# Patient Record
Sex: Female | Born: 1975 | Race: White | Hispanic: No | Marital: Married | Smoking: Current every day smoker
Health system: Southern US, Community
[De-identification: ages and names within clinical notes are randomized; demographics above are authoritative.]

## PROBLEM LIST (undated history)

## (undated) DIAGNOSIS — F329 Major depressive disorder, single episode, unspecified: Secondary | ICD-10-CM

## (undated) DIAGNOSIS — J189 Pneumonia, unspecified organism: Secondary | ICD-10-CM

## (undated) DIAGNOSIS — F419 Anxiety disorder, unspecified: Secondary | ICD-10-CM

## (undated) DIAGNOSIS — A419 Sepsis, unspecified organism: Secondary | ICD-10-CM

## (undated) DIAGNOSIS — F32A Depression, unspecified: Secondary | ICD-10-CM

## (undated) DIAGNOSIS — D649 Anemia, unspecified: Secondary | ICD-10-CM

## (undated) HISTORY — PX: DILATION AND CURETTAGE OF UTERUS: SHX78

## (undated) HISTORY — PX: TONSILLECTOMY: SUR1361

---

## 2013-11-15 ENCOUNTER — Emergency Department (HOSPITAL_COMMUNITY): Payer: 59

## 2013-11-15 ENCOUNTER — Emergency Department (HOSPITAL_COMMUNITY)
Admission: EM | Admit: 2013-11-15 | Discharge: 2013-11-15 | Disposition: A | Discharge status: Home / Self Care | Payer: 59 | Attending: Emergency Medicine | Admitting: Emergency Medicine

## 2013-11-15 ENCOUNTER — Encounter (HOSPITAL_COMMUNITY): Payer: Self-pay | Admitting: Emergency Medicine

## 2013-11-15 DIAGNOSIS — Z720 Tobacco use: Secondary | ICD-10-CM | POA: Diagnosis not present

## 2013-11-15 DIAGNOSIS — N8320 Unspecified ovarian cysts: Secondary | ICD-10-CM | POA: Insufficient documentation

## 2013-11-15 DIAGNOSIS — F419 Anxiety disorder, unspecified: Secondary | ICD-10-CM | POA: Diagnosis not present

## 2013-11-15 DIAGNOSIS — F319 Bipolar disorder, unspecified: Secondary | ICD-10-CM | POA: Insufficient documentation

## 2013-11-15 DIAGNOSIS — Z8619 Personal history of other infectious and parasitic diseases: Secondary | ICD-10-CM | POA: Diagnosis not present

## 2013-11-15 DIAGNOSIS — Z8701 Personal history of pneumonia (recurrent): Secondary | ICD-10-CM | POA: Diagnosis not present

## 2013-11-15 DIAGNOSIS — N760 Acute vaginitis: Secondary | ICD-10-CM | POA: Insufficient documentation

## 2013-11-15 DIAGNOSIS — Z3202 Encounter for pregnancy test, result negative: Secondary | ICD-10-CM | POA: Insufficient documentation

## 2013-11-15 DIAGNOSIS — R109 Unspecified abdominal pain: Secondary | ICD-10-CM | POA: Diagnosis present

## 2013-11-15 DIAGNOSIS — Z79899 Other long term (current) drug therapy: Secondary | ICD-10-CM | POA: Insufficient documentation

## 2013-11-15 DIAGNOSIS — B9689 Other specified bacterial agents as the cause of diseases classified elsewhere: Secondary | ICD-10-CM

## 2013-11-15 DIAGNOSIS — N83201 Unspecified ovarian cyst, right side: Secondary | ICD-10-CM

## 2013-11-15 HISTORY — DX: Major depressive disorder, single episode, unspecified: F32.9

## 2013-11-15 HISTORY — DX: Depression, unspecified: F32.A

## 2013-11-15 HISTORY — DX: Sepsis, unspecified organism: A41.9

## 2013-11-15 HISTORY — DX: Anxiety disorder, unspecified: F41.9

## 2013-11-15 HISTORY — DX: Pneumonia, unspecified organism: J18.9

## 2013-11-15 LAB — COMPREHENSIVE METABOLIC PANEL WITH GFR
ALT: 7 U/L (ref 0–35)
AST: 10 U/L (ref 0–37)
Albumin: 3.6 g/dL (ref 3.5–5.2)
Alkaline Phosphatase: 40 U/L (ref 39–117)
Anion gap: 13 (ref 5–15)
BUN: 12 mg/dL (ref 6–23)
CO2: 25 meq/L (ref 19–32)
Calcium: 8.3 mg/dL — ABNORMAL LOW (ref 8.4–10.5)
Chloride: 101 meq/L (ref 96–112)
Creatinine, Ser: 0.72 mg/dL (ref 0.50–1.10)
GFR calc Af Amer: >90 mL/min
GFR calc non Af Amer: >90 mL/min
Glucose, Bld: 83 mg/dL (ref 70–99)
Potassium: 3.4 meq/L — ABNORMAL LOW (ref 3.7–5.3)
Sodium: 139 meq/L (ref 137–147)
Total Bilirubin: <0.2 mg/dL — ABNORMAL LOW (ref 0.3–1.2)
Total Protein: 6.4 g/dL (ref 6.0–8.3)

## 2013-11-15 LAB — URINALYSIS, ROUTINE W REFLEX MICROSCOPIC
Bilirubin Urine: NEGATIVE
GLUCOSE, UA: NEGATIVE mg/dL
HGB URINE DIPSTICK: NEGATIVE
Ketones, ur: NEGATIVE mg/dL
Leukocytes, UA: NEGATIVE
Nitrite: NEGATIVE
Protein, ur: NEGATIVE mg/dL
Specific Gravity, Urine: 1.015 (ref 1.005–1.030)
Urobilinogen, UA: 0.2 mg/dL (ref 0.0–1.0)
pH: 6 (ref 5.0–8.0)

## 2013-11-15 LAB — CBC WITH DIFFERENTIAL/PLATELET
Basophils Absolute: 0.1 K/uL (ref 0.0–0.1)
Basophils Relative: 1 % (ref 0–1)
Eosinophils Absolute: 0.2 K/uL (ref 0.0–0.7)
Eosinophils Relative: 2 % (ref 0–5)
HCT: 35.1 % — ABNORMAL LOW (ref 36.0–46.0)
Hemoglobin: 11.3 g/dL — ABNORMAL LOW (ref 12.0–15.0)
Lymphocytes Relative: 40 % (ref 12–46)
Lymphs Abs: 3.2 K/uL (ref 0.7–4.0)
MCH: 26.3 pg (ref 26.0–34.0)
MCHC: 32.2 g/dL (ref 30.0–36.0)
MCV: 81.8 fL (ref 78.0–100.0)
Monocytes Absolute: 0.7 K/uL (ref 0.1–1.0)
Monocytes Relative: 9 % (ref 3–12)
Neutro Abs: 3.8 K/uL (ref 1.7–7.7)
Neutrophils Relative %: 48 % (ref 43–77)
Platelets: 202 K/uL (ref 150–400)
RBC: 4.29 MIL/uL (ref 3.87–5.11)
RDW: 15 % (ref 11.5–15.5)
WBC: 8 K/uL (ref 4.0–10.5)

## 2013-11-15 LAB — WET PREP, GENITAL
TRICH WET PREP: NONE SEEN
YEAST WET PREP: NONE SEEN

## 2013-11-15 LAB — PREGNANCY, URINE: Preg Test, Ur: NEGATIVE

## 2013-11-15 MED ORDER — KETOROLAC TROMETHAMINE 30 MG/ML IJ SOLN
30.0000 mg | Freq: Once | INTRAMUSCULAR | Status: AC
  Administered 2013-11-15: 30 mg via INTRAVENOUS
  Filled 2013-11-15: qty 1

## 2013-11-15 MED ORDER — HYDROCODONE-ACETAMINOPHEN 5-325 MG PO TABS
1.0000 | ORAL_TABLET | Freq: Four times a day (QID) | ORAL | Status: AC | PRN
Start: 2013-11-15 — End: ?

## 2013-11-15 MED ORDER — METRONIDAZOLE 500 MG PO TABS: 500.0000 mg | ORAL_TABLET | Freq: Two times a day (BID) | ORAL | Status: AC

## 2013-11-15 MED ORDER — ONDANSETRON HCL 4 MG/2ML IJ SOLN
4.0000 mg | Freq: Once | INTRAMUSCULAR | Status: AC
  Administered 2013-11-15: 4 mg via INTRAVENOUS
  Filled 2013-11-15: qty 2

## 2013-11-15 MED ORDER — ONDANSETRON 4 MG PO TBDP: 4.0000 mg | ORAL_TABLET | Freq: Three times a day (TID) | ORAL | Status: AC | PRN

## 2013-11-15 MED ORDER — SODIUM CHLORIDE 0.9 % IV SOLN
INTRAVENOUS | Status: DC
Start: 2013-11-15 — End: 2013-11-15
  Administered 2013-11-15: 04:00:00 via INTRAVENOUS

## 2013-11-15 MED ORDER — HYDROCODONE-ACETAMINOPHEN 5-325 MG PO TABS
1.0000 | ORAL_TABLET | ORAL | Status: AC | PRN
Start: 2013-11-15 — End: ?

## 2013-11-15 MED ORDER — IBUPROFEN 800 MG PO TABS: 800.0000 mg | ORAL_TABLET | Freq: Three times a day (TID) | ORAL | Status: AC | PRN

## 2013-11-15 NOTE — Discharge Instructions (Signed)
Ovarian Cyst An ovarian cyst is a fluid-filled sac that forms on an ovary. The ovaries are small organs that produce eggs in women. Various types of cysts can form on the ovaries. Most are not cancerous. Many do not cause problems, and they often go away on their own. Some may cause symptoms and require treatment. Common types of ovarian cysts include:  Functional cysts--These cysts may occur every month during the menstrual cycle. This is normal. The cysts usually go away with the next menstrual cycle if the woman does not get pregnant. Usually, there are no symptoms with a functional cyst.  Endometrioma cysts--These cysts form from the tissue that lines the uterus. They are also called "chocolate cysts" because they become filled with blood that turns brown. This type of cyst can cause pain in the lower abdomen during intercourse and with your menstrual period.  Cystadenoma cysts--This type develops from the cells on the outside of the ovary. These cysts can get very big and cause lower abdomen pain and pain with intercourse. This type of cyst can twist on itself, cut off its blood supply, and cause severe pain. It can also easily rupture and cause a lot of pain.  Dermoid cysts--This type of cyst is sometimes found in both ovaries. These cysts may contain different kinds of body tissue, such as skin, teeth, hair, or cartilage. They usually do not cause symptoms unless they get very big.  Theca lutein cysts--These cysts occur when too much of a certain hormone (human chorionic gonadotropin) is produced and overstimulates the ovaries to produce an egg. This is most common after procedures used to assist with the conception of a baby (in vitro fertilization). CAUSES   Fertility drugs can cause a condition in which multiple large cysts are formed on the ovaries. This is called ovarian hyperstimulation syndrome.  A condition called polycystic ovary syndrome can cause hormonal imbalances that can lead to  nonfunctional ovarian cysts. SIGNS AND SYMPTOMS  Many ovarian cysts do not cause symptoms. If symptoms are present, they may include:  Pelvic pain or pressure.  Pain in the lower abdomen.  Pain during sexual intercourse.  Increasing girth (swelling) of the abdomen.  Abnormal menstrual periods.  Increasing pain with menstrual periods.  Stopping having menstrual periods without being pregnant. DIAGNOSIS  These cysts are commonly found during a routine or annual pelvic exam. Tests may be ordered to find out more about the cyst. These tests may include:  Ultrasound.  X-ray of the pelvis.  CT scan.  MRI.  Blood tests. TREATMENT  Many ovarian cysts go away on their own without treatment. Your health care provider may want to check your cyst regularly for 2-3 months to see if it changes. For women in menopause, it is particularly important to monitor a cyst closely because of the higher rate of ovarian cancer in menopausal women. When treatment is needed, it may include any of the following:  A procedure to drain the cyst (aspiration). This may be done using a long needle and ultrasound. It can also be done through a laparoscopic procedure. This involves using a thin, lighted tube with a tiny camera on the end (laparoscope) inserted through a small incision.  Surgery to remove the whole cyst. This may be done using laparoscopic surgery or an open surgery involving a larger incision in the lower abdomen.  Hormone treatment or birth control pills. These methods are sometimes used to help dissolve a cyst. HOME CARE INSTRUCTIONS   Only take over-the-counter  or prescription medicines as directed by your health care provider.  Follow up with your health care provider as directed.  Get regular pelvic exams and Pap tests. SEEK MEDICAL CARE IF:   Your periods are late, irregular, or painful, or they stop.  Your pelvic pain or abdominal pain does not go away.  Your abdomen becomes  larger or swollen.  You have pressure on your bladder or trouble emptying your bladder completely.  You have pain during sexual intercourse.  You have feelings of fullness, pressure, or discomfort in your stomach.  You lose weight for no apparent reason.  You feel generally ill.  You become constipated.  You lose your appetite.  You develop acne.  You have an increase in body and facial hair.  You are gaining weight, without changing your exercise and eating habits.  You think you are pregnant. SEEK IMMEDIATE MEDICAL CARE IF:   You have increasing abdominal pain.  You feel sick to your stomach (nauseous), and you throw up (vomit).  You develop a fever that comes on suddenly.  You have abdominal pain during a bowel movement.  Your menstrual periods become heavier than usual. MAKE SURE YOU:  Understand these instructions.  Will watch your condition.  Will get help right away if you are not doing well or get worse. Document Released: 01/25/2005 Document Revised: 01/30/2013 Document Reviewed: 10/02/2012 [facility] Patient Information 2015 [place], [place]. This information is not intended to replace advice given to you by your health care provider. Make sure you discuss any questions you have with your health care provider.    Bacterial Vaginosis Bacterial vaginosis is a vaginal infection that occurs when the normal balance of bacteria in the vagina is disrupted. It results from an overgrowth of certain bacteria. This is the most common vaginal infection in women of childbearing age. Treatment is important to prevent complications, especially in pregnant women, as it can cause a premature delivery. CAUSES  Bacterial vaginosis is caused by an increase in harmful bacteria that are normally present in smaller amounts in the vagina. Several different kinds of bacteria can cause bacterial vaginosis. However, the reason that the condition develops is not fully understood. RISK  FACTORS Certain activities or behaviors can put you at an increased risk of developing bacterial vaginosis, including:  Having a new sex partner or multiple sex partners.  Douching.  Using an intrauterine device (IUD) for contraception. Women do not get bacterial vaginosis from toilet seats, bedding, swimming pools, or contact with objects around them. SIGNS AND SYMPTOMS  Some women with bacterial vaginosis have no signs or symptoms. Common symptoms include:  Grey vaginal discharge.  A fishlike odor with discharge, especially after sexual intercourse.  Itching or burning of the vagina and vulva.  Burning or pain with urination. DIAGNOSIS  Your health care provider will take a medical history and examine the vagina for signs of bacterial vaginosis. A sample of vaginal fluid may be taken. Your health care provider will look at this sample under a microscope to check for bacteria and abnormal cells. A vaginal pH test may also be done.  TREATMENT  Bacterial vaginosis may be treated with antibiotic medicines. These may be given in the form of a pill or a vaginal cream. A second round of antibiotics may be prescribed if the condition comes back after treatment.  HOME CARE INSTRUCTIONS   Only take over-the-counter or prescription medicines as directed by your health care provider.  If antibiotic medicine was prescribed, take it as  directed. Make sure you finish it even if you start to feel better.  Do not have sex until treatment is completed.  Tell all sexual partners that you have a vaginal infection. They should see their health care provider and be treated if they have problems, such as a mild rash or itching.  Practice safe sex by using condoms and only having one sex partner. SEEK MEDICAL CARE IF:   Your symptoms are not improving after 3 days of treatment.  You have increased discharge or pain.  You have a fever. MAKE SURE YOU:   Understand these instructions.  Will watch  your condition.  Will get help right away if you are not doing well or get worse. FOR MORE INFORMATION  Centers for Disease Control and Prevention, Division of STD Prevention: [employer]raiser[employer]raud[employer] American Sexual Health Association (ASHA): www.ashastd.org  Document Released: 01/25/2005 Document Revised: 11/15/2012 Document Reviewed: 09/06/2012 [facility] Patient Information 2015 [place], [place]. This information is not intended to replace advice given to you by your health care provider. Make sure you discuss any questions you have with your health care provider.

## 2013-11-15 NOTE — ED Provider Notes (Signed)
TIME SEEN: 2:45 AM  CHIEF COMPLAINT: Right lower quadrant pain  HPI: Patient is a 38 y.o. F with history of prior kidney stones who presents emergency department right lower quadrant pain that started around 7 PM tonight and was waxing and waning. It is now becoming constant and worse. It is worse with movement and coughing. Nothing alleviates the pain. Described as sharp. No recent fevers, chills. She has had nausea. No vomiting or diarrhea. No dysuria or hematuria. No vaginal bleeding or discharge. Last menstrual period was September 21. States this does not feel like her kidney stones. Has had this pain intermittently over the past several days but has been constant tonight. Patient works in the ICU as a Marine scientist. No recent travel, hospitalization or antibiotic use. No history of prior STDs. No history of ovarian cyst.  ROS: See HPI Constitutional: no fever  Eyes: no drainage  ENT: no runny nose   Cardiovascular:  no chest pain  Resp: no SOB  GI: no vomiting GU: no dysuria Integumentary: no rash  Allergy: no hives  Musculoskeletal: no leg swelling  Neurological: no slurred speech ROS otherwise negative  PAST MEDICAL HISTORY/PAST SURGICAL HISTORY:  Past Medical History  Diagnosis Date  . Anxiety   . Depression   . Sepsis   . Pneumonia     MEDICATIONS:  Prior to Admission medications   Medication Sig Start Date End Date Taking? Authorizing Provider  ALPRAZolam Duanne Moron) 1 MG tablet Take 1 mg by mouth at bedtime as needed for anxiety.   Yes Historical Provider, MD  buPROPion (WELLBUTRIN XL) 300 MG 24 hr tablet Take 300 mg by mouth daily.   Yes Historical Provider, MD    ALLERGIES:  Allergies  Allergen Reactions  . Ciprocinonide [Fluocinolone]   . Red Dye     SOCIAL HISTORY:  History  Substance Use Topics  . Smoking status: Current Every Day Smoker  . Smokeless tobacco: Not on file  . Alcohol Use: Yes    FAMILY HISTORY: No family history on file.  EXAM: BP 122/62   Pulse 78  Temp(Src) 98.1 F (36.7 C) (Oral)  Resp 16  Ht 5\' 4"  (1.626 m)  Wt 180 lb (81.647 kg)  BMI 30.88 kg/m2  SpO2 100%  LMP 10/29/2013 CONSTITUTIONAL: Alert and oriented and responds appropriately to questions. Well-appearing; well-nourished HEAD: Normocephalic EYES: Conjunctivae clear, PERRL ENT: normal nose; no rhinorrhea; moist mucous membranes; pharynx without lesions noted NECK: Supple, no meningismus, no LAD  CARD: RRR; S1 and S2 appreciated; no murmurs, no clicks, no rubs, no gallops RESP: Normal chest excursion without splinting or tachypnea; breath sounds clear and equal bilaterally; no wheezes, no rhonchi, no rales,  ABD/GI: Normal bowel sounds; non-distended; soft, tender to palpation the right pelvic region without guarding or rebound, no peritoneal signs GU:  Normal external genitalia, thin amount of white vaginal discharge, no cervical motion tenderness, no left adnexal tenderness, patient does have right adnexal tenderness without fullness BACK:  The back appears normal and is non-tender to palpation, there is no CVA tenderness EXT: Normal ROM in all joints; non-tender to palpation; no edema; normal capillary refill; no cyanosis    SKIN: Normal color for age and race; warm NEURO: Moves all extremities equally PSYCH: The patient's mood and manner are appropriate. Grooming and personal hygiene are appropriate.  MEDICAL DECISION MAKING: Patient here with right pelvic pain. On pelvic exam she does have right adnexal tenderness without fullness. Differential diagnosis includes ectopic, TOA, cyst, torsion. Less likely appendicitis given  patient has had symptoms for several days worsening tonight. She has no associated fevers, vomiting or diarrhea. We'll obtain urinalysis as well to evaluate for infection, hematuria that may suggest a palate nephritis, UTI or kidney stone. We'll give IV fluids, Toradol and Zofran. We'll obtain a pelvic ultrasound with Doppler.  ED PROGRESS:  Urine shows no sign of infection or hematuria. Urine pregnancy test is negative. Patient reports feeling better after Toradol and Zofran.   Patient's labs are unremarkable. Wet prep is positive for clue cells. We'll treat with Flagyl twice a day for one week. Ultrasound shows no torsion but there are 2 right ovarian cysts. Discussed with patient that she needs to followup with her primary care physician and she will likely need a followup ultrasound in 6 weeks to assure resolution. We'll discharge with prescription for ibuprofen and Vicodin. We'll give work note. Discussed supportive care instructions and return precautions. Patient verbalizes understanding and is comfortable with plan.  Alpha, DO 11/15/13 [phone]

## 2013-11-15 NOTE — ED Notes (Signed)
Abdominal pain in RLQ area that started around 1900 and has became worse per pt. Denies V/D. States that she has been nauseated.

## 2013-11-15 NOTE — ED Notes (Signed)
Patient states RLQ pain that radiates into right inguinal area. Patient sates nausea with the pain A&OX4

## 2013-11-15 NOTE — ED Notes (Signed)
Patient verbalizes understanding of discharge instructions, prescription medications, and follow up care. Patient ambulatory out of department at this time. 

## 2013-11-16 LAB — GC/CHLAMYDIA PROBE AMP
CT PROBE, AMP APTIMA: NEGATIVE
GC Probe RNA: NEGATIVE

## 2013-12-13 ENCOUNTER — Ambulatory Visit (INDEPENDENT_AMBULATORY_CARE_PROVIDER_SITE_OTHER): Payer: 59 | Admitting: Obstetrics & Gynecology

## 2013-12-13 ENCOUNTER — Encounter: Payer: Self-pay | Admitting: Obstetrics & Gynecology

## 2013-12-13 VITALS — BP 106/76 | Ht 64.0 in | Wt 190.0 lb

## 2013-12-13 DIAGNOSIS — D270 Benign neoplasm of right ovary: Secondary | ICD-10-CM

## 2013-12-13 DIAGNOSIS — D279 Benign neoplasm of unspecified ovary: Secondary | ICD-10-CM | POA: Insufficient documentation

## 2013-12-13 MED ORDER — MEGESTROL ACETATE 40 MG PO TABS: 40.0000 mg | ORAL_TABLET | Freq: Every day | ORAL | Status: DC

## 2013-12-13 MED ORDER — KETOROLAC TROMETHAMINE 10 MG PO TABS
10.0000 mg | ORAL_TABLET | Freq: Three times a day (TID) | ORAL | Status: AC | PRN
Start: 2013-12-13 — End: ?

## 2013-12-13 NOTE — Progress Notes (Signed)
Patient ID: Kristina Haas, female   DOB: Jan 09, 1976, 38 y.o.   MRN: [id number] Chief Complaint  Patient presents with  . 2 right ovarian cyst    seen at [facility] ER on 11/15/2013    Pt seen in ER for acute RLQ pain and sonogram revealed right ovarian cyst Has been present for a few weeks but now much worse Also pain with intercourse  No burning with urination, frequency or urgency No nausea, vomiting or diarrhea Nor fever chills or other constitutional symptoms  Blood pressure 106/76, height 5\' 4"  (1.626 m), weight 190 lb (86.183 kg), last menstrual period 11/26/2013. WDWN female NAD  Uterus  Measurements: 7.7 x 4.4 x 6.8 cm. No fibroids or other mass visualized. 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  Endometrium  Thickness: 8 mm. No focal abnormality visualized.  Right ovary  Measurements: 4.6 x 2.5 x 2.6 cm. Within the right ovary there is a 2.0 x 1.6 x 1.7 cm probable hemorrhagic cyst. Additional right ovarian corpus luteum. Previously described septated right ovarian cyst is no longer visualized.  Left ovary  Measurements: 5.2 x 2.2 x 2.9 cm. Within the left ovary there is a 1.5 x 1.5 x 1.8 cm probable hemorrhagic cyst.  Pulsed Doppler evaluation demonstrates normal low-resistance arterial and venous waveforms in both ovaries.  IMPRESSION: Arterial and venous waveforms demonstrated within the bilateral ovaries. No sonographic evidence to suggest acute ovarian torsion.  Probable hemorrhagic cysts within the bilateral ovaries. Short-interval follow up ultrasound in 6-12 weeks is recommended, preferably during the week following the patient's normal menses.  Previously described septated cyst within the right ovary has resolved.   RLQ pain secondary to cyst:  Will try megestrol for aggressive hormonal suppression and follow up 1 month

## 2013-12-14 LAB — CA 125: CA 125: 17 U/mL (ref <35)

## 2013-12-16 ENCOUNTER — Encounter (HOSPITAL_COMMUNITY): Payer: Self-pay | Admitting: Emergency Medicine

## 2013-12-16 ENCOUNTER — Emergency Department (HOSPITAL_COMMUNITY)
Admission: EM | Admit: 2013-12-16 | Discharge: 2013-12-16 | Disposition: A | Discharge status: Other Acute Inpatient Hospital | Payer: 59 | Attending: Emergency Medicine | Admitting: Emergency Medicine

## 2013-12-16 ENCOUNTER — Inpatient Hospital Stay (HOSPITAL_COMMUNITY): Payer: 59

## 2013-12-16 ENCOUNTER — Inpatient Hospital Stay (EMERGENCY_DEPARTMENT_HOSPITAL)
Admission: AD | Admit: 2013-12-16 | Discharge: 2013-12-16 | Disposition: A | Discharge status: Home / Self Care | Payer: 59 | Source: Ambulatory Visit | Attending: Family Medicine | Admitting: Family Medicine

## 2013-12-16 DIAGNOSIS — N83201 Unspecified ovarian cyst, right side: Secondary | ICD-10-CM

## 2013-12-16 DIAGNOSIS — Z720 Tobacco use: Secondary | ICD-10-CM | POA: Diagnosis not present

## 2013-12-16 DIAGNOSIS — R509 Fever, unspecified: Secondary | ICD-10-CM | POA: Insufficient documentation

## 2013-12-16 DIAGNOSIS — Z79899 Other long term (current) drug therapy: Secondary | ICD-10-CM | POA: Insufficient documentation

## 2013-12-16 DIAGNOSIS — F419 Anxiety disorder, unspecified: Secondary | ICD-10-CM | POA: Insufficient documentation

## 2013-12-16 DIAGNOSIS — Z8701 Personal history of pneumonia (recurrent): Secondary | ICD-10-CM | POA: Insufficient documentation

## 2013-12-16 DIAGNOSIS — F329 Major depressive disorder, single episode, unspecified: Secondary | ICD-10-CM | POA: Diagnosis not present

## 2013-12-16 DIAGNOSIS — N8320 Unspecified ovarian cysts: Secondary | ICD-10-CM | POA: Diagnosis not present

## 2013-12-16 DIAGNOSIS — Z792 Long term (current) use of antibiotics: Secondary | ICD-10-CM | POA: Insufficient documentation

## 2013-12-16 DIAGNOSIS — R1031 Right lower quadrant pain: Secondary | ICD-10-CM | POA: Diagnosis present

## 2013-12-16 DIAGNOSIS — Z8619 Personal history of other infectious and parasitic diseases: Secondary | ICD-10-CM | POA: Insufficient documentation

## 2013-12-16 DIAGNOSIS — Z9889 Other specified postprocedural states: Secondary | ICD-10-CM | POA: Insufficient documentation

## 2013-12-16 LAB — COMPREHENSIVE METABOLIC PANEL
ALT: 8 U/L (ref 0–35)
ANION GAP: 11 (ref 5–15)
AST: 13 U/L (ref 0–37)
Albumin: 4 g/dL (ref 3.5–5.2)
Alkaline Phosphatase: 37 U/L — ABNORMAL LOW (ref 39–117)
BUN: 12 mg/dL (ref 6–23)
CALCIUM: 8.8 mg/dL (ref 8.4–10.5)
CO2: 26 mEq/L (ref 19–32)
Chloride: 105 mEq/L (ref 96–112)
Creatinine, Ser: 1.04 mg/dL (ref 0.50–1.10)
GFR, EST AFRICAN AMERICAN: 78 mL/min — AB (ref >90)
GFR, EST NON AFRICAN AMERICAN: 67 mL/min — AB (ref >90)
GLUCOSE: 85 mg/dL (ref 70–99)
Potassium: 3.9 mEq/L (ref 3.7–5.3)
Sodium: 142 mEq/L (ref 137–147)
Total Bilirubin: <0.2 mg/dL — ABNORMAL LOW (ref 0.3–1.2)
Total Protein: 7.2 g/dL (ref 6.0–8.3)

## 2013-12-16 LAB — CBC WITH DIFFERENTIAL/PLATELET
Basophils Absolute: 0.1 10*3/uL (ref 0.0–0.1)
Basophils Relative: 1 % (ref 0–1)
EOS PCT: 1 % (ref 0–5)
Eosinophils Absolute: 0.1 10*3/uL (ref 0.0–0.7)
HEMATOCRIT: 36.1 % (ref 36.0–46.0)
HEMOGLOBIN: 11.7 g/dL — AB (ref 12.0–15.0)
LYMPHS ABS: 2.2 10*3/uL (ref 0.7–4.0)
Lymphocytes Relative: 22 % (ref 12–46)
MCH: 26.5 pg (ref 26.0–34.0)
MCHC: 32.4 g/dL (ref 30.0–36.0)
MCV: 81.9 fL (ref 78.0–100.0)
MONOS PCT: 7 % (ref 3–12)
Monocytes Absolute: 0.7 10*3/uL (ref 0.1–1.0)
Neutro Abs: 7 10*3/uL (ref 1.7–7.7)
Neutrophils Relative %: 69 % (ref 43–77)
PLATELETS: 180 10*3/uL (ref 150–400)
RBC: 4.41 MIL/uL (ref 3.87–5.11)
RDW: 15.1 % (ref 11.5–15.5)
WBC: 10 10*3/uL (ref 4.0–10.5)

## 2013-12-16 LAB — URINALYSIS, ROUTINE W REFLEX MICROSCOPIC
Bilirubin Urine: NEGATIVE
Glucose, UA: NEGATIVE mg/dL
Hgb urine dipstick: NEGATIVE
Ketones, ur: NEGATIVE mg/dL
Leukocytes, UA: NEGATIVE
NITRITE: NEGATIVE
PROTEIN: NEGATIVE mg/dL
SPECIFIC GRAVITY, URINE: 1.015 (ref 1.005–1.030)
UROBILINOGEN UA: 0.2 mg/dL (ref 0.0–1.0)
pH: 6.5 (ref 5.0–8.0)

## 2013-12-16 LAB — POCT PREGNANCY, URINE: PREG TEST UR: NEGATIVE

## 2013-12-16 MED ORDER — HYDROMORPHONE HCL 1 MG/ML IJ SOLN
1.0000 mg | Freq: Once | INTRAMUSCULAR | Status: AC
  Administered 2013-12-16: 1 mg via INTRAVENOUS
  Filled 2013-12-16: qty 1

## 2013-12-16 MED ORDER — HYDROMORPHONE HCL 1 MG/ML IJ SOLN
0.5000 mg | Freq: Once | INTRAMUSCULAR | Status: AC
Start: 2013-12-16 — End: 2013-12-16
  Administered 2013-12-16: 0.5 mg via INTRAMUSCULAR
  Filled 2013-12-16: qty 1

## 2013-12-16 NOTE — MAU Note (Signed)
Received pt from [facility] to [facility]U c/o abdominal pain which began 4 days ago and has become more intense with bloating. Pt denies bleeding and discharge.

## 2013-12-16 NOTE — Discharge Instructions (Signed)
Ovarian Cyst An ovarian cyst is a fluid-filled sac that forms on an ovary. The ovaries are small organs that produce eggs in women. Various types of cysts can form on the ovaries. Most are not cancerous. Many do not cause problems, and they often go away on their own. Some may cause symptoms and require treatment. Common types of ovarian cysts include:  Functional cysts--These cysts may occur every month during the menstrual cycle. This is normal. The cysts usually go away with the next menstrual cycle if the woman does not get pregnant. Usually, there are no symptoms with a functional cyst.  Endometrioma cysts--These cysts form from the tissue that lines the uterus. They are also called "chocolate cysts" because they become filled with blood that turns brown. This type of cyst can cause pain in the lower abdomen during intercourse and with your menstrual period.  Cystadenoma cysts--This type develops from the cells on the outside of the ovary. These cysts can get very big and cause lower abdomen pain and pain with intercourse. This type of cyst can twist on itself, cut off its blood supply, and cause severe pain. It can also easily rupture and cause a lot of pain.  Dermoid cysts--This type of cyst is sometimes found in both ovaries. These cysts may contain different kinds of body tissue, such as skin, teeth, hair, or cartilage. They usually do not cause symptoms unless they get very big.  Theca lutein cysts--These cysts occur when too much of a certain hormone (human chorionic gonadotropin) is produced and overstimulates the ovaries to produce an egg. This is most common after procedures used to assist with the conception of a baby (in vitro fertilization). CAUSES   Fertility drugs can cause a condition in which multiple large cysts are formed on the ovaries. This is called ovarian hyperstimulation syndrome.  A condition called polycystic ovary syndrome can cause hormonal imbalances that can lead to  nonfunctional ovarian cysts. SIGNS AND SYMPTOMS  Many ovarian cysts do not cause symptoms. If symptoms are present, they may include:  Pelvic pain or pressure.  Pain in the lower abdomen.  Pain during sexual intercourse.  Increasing girth (swelling) of the abdomen.  Abnormal menstrual periods.  Increasing pain with menstrual periods.  Stopping having menstrual periods without being pregnant. DIAGNOSIS  These cysts are commonly found during a routine or annual pelvic exam. Tests may be ordered to find out more about the cyst. These tests may include:  Ultrasound.  X-ray of the pelvis.  CT scan.  MRI.  Blood tests. TREATMENT  Many ovarian cysts go away on their own without treatment. Your health care provider may want to check your cyst regularly for 2-3 months to see if it changes. For women in menopause, it is particularly important to monitor a cyst closely because of the higher rate of ovarian cancer in menopausal women. When treatment is needed, it may include any of the following:  A procedure to drain the cyst (aspiration). This may be done using a long needle and ultrasound. It can also be done through a laparoscopic procedure. This involves using a thin, lighted tube with a tiny camera on the end (laparoscope) inserted through a small incision.  Surgery to remove the whole cyst. This may be done using laparoscopic surgery or an open surgery involving a larger incision in the lower abdomen.  Hormone treatment or birth control pills. These methods are sometimes used to help dissolve a cyst. HOME CARE INSTRUCTIONS   Only take over-the-counter   or prescription medicines as directed by your health care provider.  Follow up with your health care provider as directed.  Get regular pelvic exams and Pap tests. SEEK MEDICAL CARE IF:   Your periods are late, irregular, or painful, or they stop.  Your pelvic pain or abdominal pain does not go away.  Your abdomen becomes  larger or swollen.  You have pressure on your bladder or trouble emptying your bladder completely.  You have pain during sexual intercourse.  You have feelings of fullness, pressure, or discomfort in your stomach.  You lose weight for no apparent reason.  You feel generally ill.  You become constipated.  You lose your appetite.  You develop acne.  You have an increase in body and facial hair.  You are gaining weight, without changing your exercise and eating habits.  You think you are pregnant. SEEK IMMEDIATE MEDICAL CARE IF:   You have increasing abdominal pain.  You feel sick to your stomach (nauseous), and you throw up (vomit).  You develop a fever that comes on suddenly.  You have abdominal pain during a bowel movement.  Your menstrual periods become heavier than usual. MAKE SURE YOU:  Understand these instructions.  Will watch your condition.  Will get help right away if you are not doing well or get worse. Document Released: 01/25/2005 Document Revised: 01/30/2013 Document Reviewed: 10/02/2012 [employer] Patient Information 2015 [employer]. This information is not intended to replace advice given to you by your health care provider. Make sure you discuss any questions you have with your health care provider.  

## 2013-12-16 NOTE — ED Notes (Signed)
Pt reports has two known ovarian cysts. Pt reports nausea and RLQ pain. Pt seen for same at obgyn office. Pt reports continued pain. nad noted.

## 2013-12-16 NOTE — MAU Provider Note (Signed)
History     CSN: [id number]  Arrival date and time: 12/16/13 1932   None     Chief Complaint  Patient presents with  . Abdominal Pain   HPI pt is 38 yo G2P2 and presents with right lower abdominal pain with known right ovarian cysts:(1)hemorrhagic? And (2)large 3.2x2.9x3.3cm septated ovarian cyst Pt was seen at [facility] and sent to [facility] by private vehicle with husband-Pt had dilaudid .5mg  IM 1 1/2 hours ago before transfer Pt had [place] 10/8 which showed 2 right ovarian cysts(see [place] report below)  Pt had resolution of pain in about 2 days. Pt had recurrence of pain 5 days ago with increasing pain today (Her LMP 11/26/2013 lasting 5 days) [facility]- last seen Dr. Elonda Husky on Thursday 12/13/2013 and put on Megace(CA 125-17 /m- pt is bloated now- was to have f/u ultrasound but pt's pain has been worse today and especially this afternoon, associated with nausea and vomiting.  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  EXAM: TRANSABDOMINAL ULTRASOUND OF PELVIS  TECHNIQUE: Transabdominal ultrasound examination of the pelvis was performed including evaluation of the uterus, ovaries, adnexal regions, and pelvic cul-de-sac.  COMPARISON: None.  FINDINGS: Uterus  Measurements: 9.1 x 4.6 x 6.2 cm. No fibroids or other mass visualized. 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 Multiple nabothian cysts noted.  Endometrium  Thickness: 5.1 mm. No focal abnormality visualized.  Right ovary  Measurements: 6.2 x 3.7 x 4.9 cm. Normal color Doppler arterial and venous flow seen to the right ovary without evidence of torsion. A large septated cyst measuring 3.2 x 2.9 x 3.3 cm present within the right ovary. No internal vascularity seen within the cysts. Additional well-circumscribed hyperechoic lesion measuring 2.6 x 2.2 x  3.1 cm seen within the right ovary. No internal vascularity seen within this lesion as well. There is increased through transmission. This may represent a hemorrhagic cyst.  Left ovary  Measurements: 4.2 x 1.7 x 1.4 cm. Normal appearance/no adnexal mass. Normal color Doppler arterial and venous flow seen to the left ovary without evidence of torsion.  Other findings: Moderate free fluid within the pelvis.  IMPRESSION: 1. No sonographic evidence of ovarian torsion. 2. 3.2 x 2.9 x 3.3 cm septated right ovarian cyst, indeterminate. A follow-up ultrasound in 6 weeks to ensure resolution is recommended. 3. Additional well-circumscribed 2.6 x 2.2 x 3.1 cm hyperechoic lesion within the right ovary. This may represent a hemorrhagic cyst. This could also be further assessed at follow-up ultrasound. 4. Normal left ovary. 5. 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 6. Moderate free fluid within the pelvis, likely physiologic.   Electronically Signed  By: Jeannine Boga M.D.  On: 11/15/2013 04:55      RN note: Received pt from [facility] to MAU c/o abdominal pain which began 4 days ago and has become more intense with bloating. Pt denies bleeding and discharge  Past Medical History  Diagnosis Date  . Anxiety   . Depression   . Sepsis   . Pneumonia     Past Surgical History  Procedure Laterality Date  . Cesarean section    . Dilation and curettage of uterus      Family History  Problem Relation Age of Onset  . Cancer Mother     uterine  . Diabetes Mother   . COPD Mother   .  Hyperlipidemia Mother   . Hypertension Father   . Hyperlipidemia Father   . Diabetes Father   . Hypertension Sister   . Cancer Maternal Grandmother     uterine and ovarian  . COPD Maternal Grandfather   . Hypertension Paternal Grandmother   . Diabetes Paternal Grandmother   . Hypertension Paternal Grandfather   . Diabetes Paternal Grandfather     History  Substance Use Topics  .  Smoking status: Current Every Day Smoker -- 0.50 packs/day    Types: Cigarettes  . Smokeless tobacco: Never Used  . Alcohol Use: 0.0 oz/week    0 Not specified per week     Comment: socially    Allergies:  Allergies  Allergen Reactions  . Red Dye Nausea And Vomiting  . Ciprocinonide [Fluocinolone] Rash    Prescriptions prior to admission  Medication Sig Dispense Refill Last Dose  . acetaminophen (TYLENOL) 500 MG tablet Take 500 mg by mouth every 6 (six) hours as needed for mild pain.   12/16/2013  . ALPRAZolam (XANAX) 1 MG tablet Take 1 mg by mouth at bedtime as needed for anxiety.   12/15/2013  . buPROPion (WELLBUTRIN XL) 300 MG 24 hr tablet Take 300 mg by mouth daily.   12/16/2013  . HYDROcodone-acetaminophen (NORCO/VICODIN) 5-325 MG per tablet Take 1-2 tablets by mouth every 6 (six) hours as needed. (Patient not taking: Reported on 12/16/2013) 20 tablet 0 Taking  . HYDROcodone-acetaminophen (NORCO/VICODIN) 5-325 MG per tablet Take 1 tablet by mouth every 4 (four) hours as needed. (Patient not taking: Reported on 12/16/2013) 6 tablet 0 Not Taking  . ibuprofen (ADVIL,MOTRIN) 800 MG tablet Take 1 tablet (800 mg total) by mouth every 8 (eight) hours as needed for mild pain. (Patient not taking: Reported on 12/16/2013) 30 tablet 0 Taking  . ketorolac (TORADOL) 10 MG tablet Take 1 tablet (10 mg total) by mouth every 8 (eight) hours as needed. 15 tablet 0 12/16/2013  . megestrol (MEGACE) 40 MG tablet Take 1 tablet (40 mg total) by mouth daily. 30 tablet 2 12/16/2013  . metroNIDAZOLE (FLAGYL) 500 MG tablet Take 1 tablet (500 mg total) by mouth 2 (two) times daily. Do not drink alcohol with this medication. (Patient not taking: Reported on 12/16/2013) 14 tablet 0 Not Taking  . ondansetron (ZOFRAN ODT) 4 MG disintegrating tablet Take 1 tablet (4 mg total) by mouth every 8 (eight) hours as needed for nausea or vomiting. 20 tablet 0 12/16/2013    Review of Systems  Constitutional: Negative for fever and  chills.  Gastrointestinal: Positive for nausea, vomiting and abdominal pain. Negative for diarrhea and constipation.  Genitourinary: Negative for dysuria and urgency.   Physical Exam   Blood pressure 122/66, pulse 88, temperature 98.3 F (36.8 C), temperature source Oral, resp. rate 18, height 5\' 4"  (1.626 m), weight 192 lb (87.091 kg), last menstrual period 11/26/2013.  Physical Exam  Nursing note and vitals reviewed. Constitutional: She is oriented to person, place, and time. She appears well-developed and well-nourished. No distress.  HENT:  Head: Normocephalic.  Eyes: Pupils are equal, round, and reactive to light.  Neck: Normal range of motion.  Cardiovascular: Normal rate.   Respiratory: Effort normal.  GI: Soft. She exhibits no distension. There is tenderness. There is no rebound and no guarding.  Musculoskeletal: Normal range of motion.  Neurological: She is alert and oriented to person, place, and time.  Skin: Skin is warm and dry.  Psychiatric: She has a normal mood and affect.  MAU Course  Procedures  Results for orders placed or performed during the hospital encounter of 12/16/13 (from the past 24 hour(s))  Pregnancy, urine POC     Status: None   Collection Time: 12/16/13  8:04 PM  Result Value Ref Range   Preg Test, Ur NEGATIVE NEGATIVE  CBC and CMET drawn at [facility] and UA performed Results for CHEMIKA, NIGHTENGALE (MRN [id number]) as of 12/16/2013 22:22  Ref. Range 12/16/2013 16:44  Sodium Latest Range: 137-147 mEq/L 142  Potassium Latest Range: 3.7-5.3 mEq/L 3.9  Chloride Latest Range: 96-112 mEq/L 105  CO2 Latest Range: 19-32 mEq/L 26  BUN Latest Range: 6-23 mg/dL 12  Creatinine Latest Range: 0.50-1.10 mg/dL 1.04  Calcium Latest Range: 8.4-10.5 mg/dL 8.8  GFR calc non Af Amer Latest Range: >90 mL/min 67 (L)  GFR calc Af Amer Latest Range: >90 mL/min 78 (L)  Glucose Latest Range: 70-99 mg/dL 85  Anion gap Latest Range: 5-15  11  Alkaline Phosphatase Latest  Range: 39-117 U/L 37 (L)  Albumin Latest Range: 3.5-5.2 g/dL 4.0  AST Latest Range: 0-37 U/L 13  ALT Latest Range: 0-35 U/L 8  Total Protein Latest Range: 6.0-8.3 g/dL 7.2  Total Bilirubin Latest Range: 0.3-1.2 mg/dL <0.2 (L)  Results for MAKESHA, BELITZ (MRN [id number]) as of 12/16/2013 22:22  Ref. Range 12/16/2013 16:44  WBC Latest Range: 4.0-10.5 K/uL 10.0  RBC Latest Range: 3.87-5.11 MIL/uL 4.41  Hemoglobin Latest Range: 12.0-15.0 g/dL 11.7 (L)  HCT Latest Range: 36.0-46.0 % 36.1  MCV Latest Range: 78.0-100.0 fL 81.9  MCH Latest Range: 26.0-34.0 pg 26.5  MCHC Latest Range: 30.0-36.0 g/dL 32.4  RDW Latest Range: 11.5-15.5 % 15.1  Platelets Latest Range: 150-400 K/uL 180   Results for KHARMA, SAMPSEL (MRN [id number]) as of 12/16/2013 22:22  Ref. Range 12/16/2013 16:40  Color, Urine Latest Range: YELLOW  YELLOW  APPearance Latest Range: CLEAR  CLEAR  Specific Gravity, Urine Latest Range: 1.005-1.030  1.015  pH Latest Range: 5.0-8.0  6.5  Glucose Latest Range: NEGATIVE mg/dL NEGATIVE  Bilirubin Urine Latest Range: NEGATIVE  NEGATIVE  Ketones, ur Latest Range: NEGATIVE mg/dL NEGATIVE  Protein Latest Range: NEGATIVE mg/dL NEGATIVE  Urobilinogen, UA Latest Range: 0.0-1.0 mg/dL 0.2  Nitrite Latest Range: NEGATIVE  NEGATIVE  Leukocytes, UA Latest Range: NEGATIVE  NEGATIVE  Hgb urine dipstick Latest Range: NEGATIVE  NEGATIVE   US Transvaginal Non-ob  12/16/2013   CLINICAL DATA:  Right lower quadrant pain, nausea and vomiting. History of right ovarian cyst. Evaluate for torsion.  EXAM: TRANSVAGINAL ULTRASOUND OF PELVIS  DOPPLER ULTRASOUND OF OVARIES  TECHNIQUE: Transvaginal ultrasound examination of the pelvis was performed including evaluation of the uterus, ovaries, adnexal regions, and pelvic cul-de-sac.  Color and duplex Doppler ultrasound was utilized to evaluate blood flow to the ovaries.  It was necessary to proceed with endovaginal exam following the transabdominal exam to visualize the  adnexal structures. Color and duplex Doppler ultrasound was utilized to evaluate blood flow to the ovaries.  COMPARISON:  Pelvic ultrasound 11/15/2013  FINDINGS: Uterus  Measurements: 7.7 x 4.4 x 6.8 cm. No fibroids or other mass visualized. 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  Endometrium  Thickness: 8 mm.  No focal abnormality visualized.  Right ovary  Measurements: 4.6 x 2.5 x 2.6 cm. Within the right ovary there is a 2.0 x 1.6 x 1.7 cm probable hemorrhagic cyst. Additional right ovarian corpus luteum. Previously described septated right ovarian cyst is no longer visualized.  Left ovary  Measurements: 5.2 x 2.2  x 2.9 cm. Within the left ovary there is a 1.5 x 1.5 x 1.8 cm probable hemorrhagic cyst.  Pulsed Doppler evaluation demonstrates normal low-resistance arterial and venous waveforms in both ovaries.  IMPRESSION: Arterial and venous waveforms demonstrated within the bilateral ovaries. No sonographic evidence to suggest acute ovarian torsion.  Probable hemorrhagic cysts within the bilateral ovaries. Short-interval follow up ultrasound in 6-12 weeks is recommended, preferably during the week following the patient's normal menses.  Previously described septated cyst within the right ovary has resolved.   Electronically Signed   By: Lovey Newcomer M.D.   On: 12/16/2013 22:17   [place] Art/ven Flow Abd Pelv Doppler  12/16/2013   CLINICAL DATA:  Right lower quadrant pain, nausea and vomiting. History of right ovarian cyst. Evaluate for torsion.  EXAM: TRANSVAGINAL ULTRASOUND OF PELVIS  DOPPLER ULTRASOUND OF OVARIES  TECHNIQUE: Transvaginal ultrasound examination of the pelvis was performed including evaluation of the uterus, ovaries, adnexal regions, and pelvic cul-de-sac.  Color and duplex Doppler ultrasound was utilized to evaluate blood flow to the ovaries.  It was necessary to proceed with endovaginal exam following the transabdominal exam to visualize the adnexal structures. Color and duplex  Doppler ultrasound was utilized to evaluate blood flow to the ovaries.  COMPARISON:  Pelvic ultrasound 11/15/2013  FINDINGS: Uterus  Measurements: 7.7 x 4.4 x 6.8 cm. No fibroids or other mass visualized. 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  Endometrium  Thickness: 8 mm.  No focal abnormality visualized.  Right ovary  Measurements: 4.6 x 2.5 x 2.6 cm. Within the right ovary there is a 2.0 x 1.6 x 1.7 cm probable hemorrhagic cyst. Additional right ovarian corpus luteum. Previously described septated right ovarian cyst is no longer visualized.  Left ovary  Measurements: 5.2 x 2.2 x 2.9 cm. Within the left ovary there is a 1.5 x 1.5 x 1.8 cm probable hemorrhagic cyst.  Pulsed Doppler evaluation demonstrates normal low-resistance arterial and venous waveforms in both ovaries.  IMPRESSION: Arterial and venous waveforms demonstrated within the bilateral ovaries. No sonographic evidence to suggest acute ovarian torsion.  Probable hemorrhagic cysts within the bilateral ovaries. Short-interval follow up ultrasound in 6-12 weeks is recommended, preferably during the week following the patient's normal menses.  Previously described septated cyst within the right ovary has resolved.   Electronically Signed   By: Lovey Newcomer M.D.   On: 12/16/2013 22:17   Pt's pain is slowly creeping up after Dilaudid at [facility] is OK right now Care handed over to Marcille Buffy, CNM Assessment and Plan   1. Ovarian cyst, right   2. Right ovarian cyst    Comfort measures reviewed Continue Toradol, Vicodin as prescribed FU with FT   Follow-up Information    Follow up with [facility]. Call in 1 day.   Specialty:  Obstetrics and Gynecology   Contact information:   [address] [place] [phone]     Lorrin Goodell 12/16/2013, 8:08 PM

## 2013-12-16 NOTE — ED Provider Notes (Signed)
CSN: [id number]     Arrival date & time 12/16/13  1629 History  This chart was scribed for [employer]. Alvino Chapel, MD by Randa Evens, ED Scribe. This patient was seen in room APA07/APA07 and the patient's care was started at 5:38 PM.    Chief Complaint  Patient presents with  . Abdominal Pain   Patient is a 38 y.o. female presenting with abdominal pain. The history is provided by the patient. No language interpreter was used.  Abdominal Pain Associated symptoms: fever, nausea and vomiting    HPI Comments: Kristina Haas is a 38 y.o. female who presents to the Emergency Department complaining of RLQ abdominal pain onset October 8th. She states that the pain has recently worsened over the past 2 days. She states she has associated abdominal distention, nausea and vomiting. She states she has had some fevers but have resolved since taking ibuprofen and tylenol. She states that the pain is radiating to her right flank, right leg, and into her pelvis. She states she has been taking Toradol and hydrocodone with no relief. She states she has Hx of ovarian cysts. She states she recently visited the OBGYN and has been compliant with taking the Megace but her symptoms have not been improving.    Past Medical History  Diagnosis Date  . Anxiety   . Depression   . Sepsis   . Pneumonia    Past Surgical History  Procedure Laterality Date  . Cesarean section    . Dilation and curettage of uterus     Family History  Problem Relation Age of Onset  . Cancer Mother     uterine  . Diabetes Mother   . COPD Mother   . Hyperlipidemia Mother   . Hypertension Father   . Hyperlipidemia Father   . Diabetes Father   . Hypertension Sister   . Cancer Maternal Grandmother     uterine and ovarian  . COPD Maternal Grandfather   . Hypertension Paternal Grandmother   . Diabetes Paternal Grandmother   . Hypertension Paternal Grandfather   . Diabetes Paternal Grandfather    History  Substance Use Topics  .  Smoking status: Current Every Day Smoker -- 0.50 packs/day    Types: Cigarettes  . Smokeless tobacco: Never Used  . Alcohol Use: 0.0 oz/week    0 Not specified per week     Comment: socially   OB History    No data available     Review of Systems  Constitutional: Positive for fever.  Gastrointestinal: Positive for nausea, vomiting, abdominal pain and abdominal distention.      Allergies  Red dye and Ciprocinonide  Home Medications   Prior to Admission medications   Medication Sig Start Date End Date Taking? Authorizing Provider  acetaminophen (TYLENOL) 500 MG tablet Take 500 mg by mouth every 6 (six) hours as needed for mild pain.   Yes Historical Provider, MD  ALPRAZolam Duanne Moron) 1 MG tablet Take 1 mg by mouth at bedtime as needed for anxiety.   Yes Historical Provider, MD  buPROPion (WELLBUTRIN XL) 300 MG 24 hr tablet Take 300 mg by mouth daily.   Yes Historical Provider, MD  ketorolac (TORADOL) 10 MG tablet Take 1 tablet (10 mg total) by mouth every 8 (eight) hours as needed. 12/13/13  Yes Florian Buff, MD  megestrol (MEGACE) 40 MG tablet Take 1 tablet (40 mg total) by mouth daily. 12/13/13  Yes Florian Buff, MD  ondansetron (ZOFRAN ODT) 4 MG disintegrating tablet  Take 1 tablet (4 mg total) by mouth every 8 (eight) hours as needed for nausea or vomiting. 11/15/13  Yes Kristen N Ward, DO  HYDROcodone-acetaminophen (NORCO/VICODIN) 5-325 MG per tablet Take 1-2 tablets by mouth every 6 (six) hours as needed. Patient not taking: Reported on 12/16/2013 11/15/13   Delice Bison Ward, DO  HYDROcodone-acetaminophen (NORCO/VICODIN) 5-325 MG per tablet Take 1 tablet by mouth every 4 (four) hours as needed. Patient not taking: Reported on 12/16/2013 11/15/13   Kristen N Ward, DO  ibuprofen (ADVIL,MOTRIN) 800 MG tablet Take 1 tablet (800 mg total) by mouth every 8 (eight) hours as needed for mild pain. Patient not taking: Reported on 12/16/2013 11/15/13   Kristen N Ward, DO  metroNIDAZOLE (FLAGYL) 500  MG tablet Take 1 tablet (500 mg total) by mouth 2 (two) times daily. Do not drink alcohol with this medication. Patient not taking: Reported on 12/16/2013 11/15/13   Delice Bison Ward, DO   Triage Vitals: BP 119/72 mmHg  Pulse 83  Temp(Src) 98.5 F (36.9 C) (Oral)  Resp 16  Ht 5\' 4"  (1.626 m)  Wt 190 lb (86.183 kg)  BMI 32.60 kg/m2  SpO2 100%  LMP 11/26/2013  Physical Exam  Constitutional: She is oriented to person, place, and time. She appears well-developed and well-nourished. No distress.  HENT:  Head: Normocephalic and atraumatic.  Eyes: Conjunctivae and EOM are normal.  Neck: Neck supple. No tracheal deviation present.  Cardiovascular: Normal rate.   Pulmonary/Chest: Effort normal. No respiratory distress.  Abdominal: There is tenderness (moderate ) in the right lower quadrant. There is guarding.  No hernias palpated.   Musculoskeletal: Normal range of motion.  Neurological: She is alert and oriented to person, place, and time.  Skin: Skin is warm and dry. No rash noted.  Psychiatric: She has a normal mood and affect. Her behavior is normal.  Nursing note and vitals reviewed.   ED Course  Procedures (including critical care time)  Labs Review Labs Reviewed  CBC WITH DIFFERENTIAL - Abnormal; Notable for the following:    Hemoglobin 11.7 (*)    All other components within normal limits  COMPREHENSIVE METABOLIC PANEL - Abnormal; Notable for the following:    Alkaline Phosphatase 37 (*)    Total Bilirubin <0.2 (*)    GFR calc non Af Amer 67 (*)    GFR calc Af Amer 78 (*)    All other components within normal limits  URINALYSIS, ROUTINE W REFLEX MICROSCOPIC    Imaging Review No results found.   EKG Interpretation None      MDM   Final diagnoses:  Cyst of right ovary     Patient with right lower quadrant pain. Recently seen and diagnosed with septated ovarian cyst and hemorrhagic ovarian cyst.hemoglobin is decreased mildly. Recently started on Megace.  Discussed with Dr. Elly Modena. We do not have access to ultrasound here tonight and will transfer to MAU for further evaluation for pain and possible ovarian torsion. Will go by private vehicle   I personally performed the services described in this documentation, which was scribed in my presence. The recorded information has been reviewed and is accurate.       Jasper Riling. Alvino Chapel, MD 12/16/13 1818

## 2013-12-16 NOTE — MAU Note (Signed)
Pt works as a Therapist, sports at [employer].

## 2013-12-16 NOTE — MAU Note (Signed)
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

## 2013-12-17 ENCOUNTER — Telehealth: Payer: Self-pay | Admitting: Obstetrics & Gynecology

## 2013-12-17 NOTE — Telephone Encounter (Signed)
Spoke with Dr.Eure stated for pt to continue to take the Megace and keep her f/u appt. Pt verbalized understanding.

## 2013-12-17 NOTE — Telephone Encounter (Signed)
Pt states saw Dr. Elonda Husky on last Thursday went to [facility] last night due abdominal pain. Pt states was told at [facility] they did not see septated cyst on transvaginal ultrasound and did pt need to continue to take the Megace. Ultrasound at [facility] did reveal a right ovarian cyst. Please advise.

## 2014-01-24 ENCOUNTER — Other Ambulatory Visit: Payer: Self-pay | Admitting: Obstetrics & Gynecology

## 2014-01-24 ENCOUNTER — Ambulatory Visit (INDEPENDENT_AMBULATORY_CARE_PROVIDER_SITE_OTHER): Payer: 59

## 2014-01-24 ENCOUNTER — Ambulatory Visit (INDEPENDENT_AMBULATORY_CARE_PROVIDER_SITE_OTHER): Payer: 59 | Admitting: Obstetrics & Gynecology

## 2014-01-24 ENCOUNTER — Encounter: Payer: Self-pay | Admitting: Obstetrics & Gynecology

## 2014-01-24 VITALS — BP 110/80 | Wt 186.0 lb

## 2014-01-24 DIAGNOSIS — D270 Benign neoplasm of right ovary: Secondary | ICD-10-CM

## 2014-01-24 DIAGNOSIS — N8320 Unspecified ovarian cysts: Secondary | ICD-10-CM

## 2014-01-24 DIAGNOSIS — R1031 Right lower quadrant pain: Secondary | ICD-10-CM | POA: Diagnosis not present

## 2014-01-24 DIAGNOSIS — R102 Pelvic and perineal pain: Secondary | ICD-10-CM

## 2014-01-24 DIAGNOSIS — N83201 Unspecified ovarian cyst, right side: Secondary | ICD-10-CM

## 2014-01-24 MED ORDER — ONDANSETRON HCL 8 MG PO TABS: 8.0000 mg | ORAL_TABLET | Freq: Three times a day (TID) | ORAL | Status: AC | PRN

## 2014-01-24 MED ORDER — OXYCODONE-ACETAMINOPHEN 5-325 MG PO TABS: 1.0000 | ORAL_TABLET | ORAL | Status: AC | PRN

## 2014-01-24 MED ORDER — KETOROLAC TROMETHAMINE 10 MG PO TABS: 10.0000 mg | ORAL_TABLET | Freq: Three times a day (TID) | ORAL | Status: AC | PRN

## 2014-01-24 NOTE — Progress Notes (Signed)
Patient ID: Kristina Haas, female   DOB: 1975/09/17, 38 y.o.   MRN: [id number] Preoperative History and Physical  Emmett Arntz is a 38 y.o. No obstetric history on file. with Patient's last menstrual period was 01/01/2014. admitted for a laparoscopic right salpingo oophorectomy due to persistent and painful right ovarian cyst.  Unresponsive to megestrol suppression  PMH:    Past Medical History  Diagnosis Date  . Anxiety   . Depression   . Sepsis   . Pneumonia     PSH:     Past Surgical History  Procedure Laterality Date  . Cesarean section    . Dilation and curettage of uterus      POb/GynH:      OB History    No data available      SH:   History  Substance Use Topics  . Smoking status: Current Every Day Smoker -- 0.50 packs/day    Types: Cigarettes  . Smokeless tobacco: Never Used  . Alcohol Use: 0.0 oz/week    0 Not specified per week     Comment: socially    FH:    Family History  Problem Relation Age of Onset  . Cancer Mother     uterine  . Diabetes Mother   . COPD Mother   . Hyperlipidemia Mother   . Hypertension Father   . Hyperlipidemia Father   . Diabetes Father   . Hypertension Sister   . Cancer Maternal Grandmother     uterine and ovarian  . COPD Maternal Grandfather   . Hypertension Paternal Grandmother   . Diabetes Paternal Grandmother   . Hypertension Paternal Grandfather   . Diabetes Paternal Grandfather      Allergies:  Allergies  Allergen Reactions  . Red Dye Nausea And Vomiting  . Ciprofloxacin Hcl Rash    Medications:      Current outpatient prescriptions: ALPRAZolam (XANAX) 1 MG tablet, Take 1 mg by mouth at bedtime as needed for anxiety., Disp: , Rfl: ;  buPROPion (WELLBUTRIN XL) 300 MG 24 hr tablet, Take 300 mg by mouth daily., Disp: , Rfl: ;  diphenhydrAMINE (BENADRYL) 25 MG tablet, Take 25 mg by mouth at bedtime as needed for allergies or sleep., Disp: , Rfl:  HYDROcodone-acetaminophen (NORCO/VICODIN) 5-325 MG per tablet, Take  1-2 tablets by mouth every 6 (six) hours as needed. (Patient taking differently: Take 1 tablet by mouth every 6 (six) hours as needed for severe pain. ), Disp: 20 tablet, Rfl: 0;  ibuprofen (ADVIL,MOTRIN) 800 MG tablet, Take 1 tablet (800 mg total) by mouth every 8 (eight) hours as needed for mild pain., Disp: 30 tablet, Rfl: 0 acetaminophen (TYLENOL) 500 MG tablet, Take 500 mg by mouth every 6 (six) hours as needed for mild pain., Disp: , Rfl: ;  HYDROcodone-acetaminophen (NORCO/VICODIN) 5-325 MG per tablet, Take 1 tablet by mouth every 4 (four) hours as needed. (Patient not taking: Reported on 12/16/2013), Disp: 6 tablet, Rfl: 0 ketorolac (TORADOL) 10 MG tablet, Take 1 tablet (10 mg total) by mouth every 8 (eight) hours as needed. (Patient not taking: Reported on 01/24/2014), Disp: 15 tablet, Rfl: 0;  megestrol (MEGACE) 40 MG tablet, Take 1 tablet (40 mg total) by mouth daily. (Patient not taking: Reported on 01/24/2014), Disp: 30 tablet, Rfl: 2 metroNIDAZOLE (FLAGYL) 500 MG tablet, Take 1 tablet (500 mg total) by mouth 2 (two) times daily. Do not drink alcohol with this medication. (Patient not taking: Reported on 12/16/2013), Disp: 14 tablet, Rfl: 0;  ondansetron (ZOFRAN ODT)  4 MG disintegrating tablet, Take 1 tablet (4 mg total) by mouth every 8 (eight) hours as needed for nausea or vomiting. (Patient not taking: Reported on 01/24/2014), Disp: 20 tablet, Rfl: 0  Review of Systems:   Review of Systems  Constitutional: Negative for fever, chills, weight loss, malaise/fatigue and diaphoresis.  HENT: Negative for hearing loss, ear pain, nosebleeds, congestion, sore throat, neck pain, tinnitus and ear discharge.   Eyes: Negative for blurred vision, double vision, photophobia, pain, discharge and redness.  Respiratory: Negative for cough, hemoptysis, sputum production, shortness of breath, wheezing and stridor.   Cardiovascular: Negative for chest pain, palpitations, orthopnea, claudication, leg swelling  and PND.  Gastrointestinal: Positive for abdominal pain. Negative for heartburn, nausea, vomiting, diarrhea, constipation, blood in stool and melena.  Genitourinary: Negative for dysuria, urgency, frequency, hematuria and flank pain.  Musculoskeletal: Negative for myalgias, back pain, joint pain and falls.  Skin: Negative for itching and rash.  Neurological: Negative for dizziness, tingling, tremors, sensory change, speech change, focal weakness, seizures, loss of consciousness, weakness and headaches.  Endo/Heme/Allergies: Negative for environmental allergies and polydipsia. Does not bruise/bleed easily.  Psychiatric/Behavioral: Negative for depression, suicidal ideas, hallucinations, memory loss and substance abuse. The patient is not nervous/anxious and does not have insomnia.      PHYSICAL EXAM:  Blood pressure 110/80, weight 186 lb (84.369 kg), last menstrual period 01/01/2014.    Vitals reviewed. Constitutional: She is oriented to person, place, and time. She appears well-developed and well-nourished.  HENT:  Head: Normocephalic and atraumatic.  Right Ear: External ear normal.  Left Ear: External ear normal.  Nose: Nose normal.  Mouth/Throat: Oropharynx is clear and moist.  Eyes: Conjunctivae and EOM are normal. Pupils are equal, round, and reactive to light. Right eye exhibits no discharge. Left eye exhibits no discharge. No scleral icterus.  Neck: Normal range of motion. Neck supple. No tracheal deviation present. No thyromegaly present.  Cardiovascular: Normal rate, regular rhythm, normal heart sounds and intact distal pulses.  Exam reveals no gallop and no friction rub.   No murmur heard. Respiratory: Effort normal and breath sounds normal. No respiratory distress. She has no wheezes. She has no rales. She exhibits no tenderness.  GI: Soft. Bowel sounds are normal. She exhibits no distension and no mass. There is tenderness. There is no rebound and no guarding.  Genitourinary:        Vulva is normal without lesions Vagina is pink moist without discharge Cervix normal in appearance and pap is normal Uterus is enlarged to 16-18 weeks size due to multiple fibroids the largest of which is 6.1 cm Adnexa is negative with normal sized ovaries by sonogram  Musculoskeletal: Normal range of motion. She exhibits no edema and no tenderness.  Neurological: She is alert and oriented to person, place, and time. She has normal reflexes. She displays normal reflexes. No cranial nerve deficit. She exhibits normal muscle tone. Coordination normal.  Skin: Skin is warm and dry. No rash noted. No erythema. No pallor.  Psychiatric: She has a normal mood and affect. Her behavior is normal. Judgment and thought content normal.    Labs: No results found for this or any previous visit (from the past 336 hour(s)).  EKG: No orders found for this or any previous visit.  Imaging Studies: US Pelvis Limited  01/24/2014   GYNECOLOGIC SONOGRAM   Borghild Thaker is a 38 y.o. LMP 01/01/2014 for a pelvic sonogram for RLQ  pain and Rt ovarian cyst.  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,  retroverted  Endometrium          Rt=3.8 mm LT=4.51mm, symmetrical,   Right ovary             4.2 x 3.6 x 3.0 cm, with 2.8 x 2.8 x 2.2cm complex  cyst noted with internal debris noted within, +Perfusion noted   Left ovary                3.7 x 2.3 x 1.6 cm,   Free fluid noted within the posterior cul-de-sac  Technician Comments:  Retroverted Bicornuate uterus, Rt ovary with complex cyst remains, Lt  ovary appears WNL, +Free fluid noted within the posterior Cul-de-sac   Lazarus Gowda 01/24/2014 9:52 AM   Clinical Impression and recommendations:  I have reviewed the sonogram results above, combined with the patient's  current clinical course, below are my impressions and any appropriate  recommendations for management based on the sonographic findings.  Persistent right ovarian cyst, unresponsive  to aggressive hormonal  suppression therapy Otherwise normal pelvic sonogram   EURE,LUTHER H 01/24/2014 10:59 AM         Assessment: Patient Active Problem List   Diagnosis Date Noted  . 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

## 2014-01-25 NOTE — Patient Instructions (Signed)
Kristina Haas  01/25/2014   Your procedure is scheduled on:   01/30/2014  Report to [facility] at  700  AM.  Call this number if you have problems the morning of surgery: [phone]   Remember:   Do not eat food or drink liquids after midnight.   Take these medicines the morning of surgery with A SIP OF WATER:  Xanax, wellbutrin, hydrocodone or oxycodone, zofran.   Do not wear jewelry, make-up or nail polish.  Do not wear lotions, powders, or perfumes.   Do not shave 48 hours prior to surgery. Men may shave face and neck.  Do not bring valuables to the hospital.  [facility] is not responsible  for any belongings or valuables.               Contacts, dentures or bridgework may not be worn into surgery.  Leave suitcase in the car. After surgery it may be brought to your room.  For patients admitted to the hospital, discharge time is determined by your treatment team.               Patients discharged the day of surgery will not be allowed to drive home.  Name and phone number of your driver: family  Special Instructions: Shower using CHG 2 nights before surgery and the night before surgery.  If you shower the day of surgery use CHG.  Use special wash - you have one bottle of CHG for all showers.  You should use approximately 1/3 of the bottle for each shower.   Please read over the following fact sheets that you were given: Pain Booklet, Coughing and Deep Breathing, Surgical Site Infection Prevention, Anesthesia Post-op Instructions and Care and Recovery After Surgery Unilateral Salpingo-Oophorectomy Unilateral salpingo-oophorectomy is the surgical removal of one fallopian tube and ovary. The ovaries are small organs that produce eggs in women. The fallopian tubes transport the egg from the ovary to the womb (uterus). A unilateral salpingo-oophorectomy may be done for various reasons, including:  Infection in the fallopian tube and ovary.  Scar tissue in the fallopian tube and  ovary (adhesions).  A cyst or tumor on the ovary.  A need to remove the fallopian tube and ovary when removing the uterus.  Cancer of the fallopian tube or ovary. The removal of one fallopian tube and ovary will not prevent you from becoming pregnant, put you into menopause, or cause problems with your menstrual periods or sex drive. LET [facility] CARE PROVIDER KNOW ABOUT:  Any allergies you have.  All medicines you are taking, including vitamins, herbs, eye drops, creams, and over-the-counter medicines.  Previous problems you or members of your family have had with the use of anesthetics.  Any blood disorders you have.  Previous surgeries you have had.  Medical conditions you have. RISKS AND COMPLICATIONS  Generally, this is a safe procedure. However, as with any procedure, complications can occur. Possible complications include:  Injury to surrounding organs.  Bleeding.  Infection.  Blood clots in the legs or lungs.  Problems related to anesthesia. BEFORE THE PROCEDURE  Ask your health care provider about changing or stopping your regular medicines. You may need to stop taking certain medicines, such as aspirin or blood thinners, at least 1 week before the surgery.  Do not eat or drink anything for at least 8 hours before the surgery.  If you smoke, do not smoke for at least 2 weeks before the surgery.  Make  plans to have someone drive you home after the procedure or after your hospital stay. Also arrange for someone to help you with activities during recovery. PROCEDURE  You will be given medicine to help you relax before the procedure (sedative). You will then be given medicine to make you sleep through the procedure (general anesthetic). These medicines will be given through an IV access tube that is put into one of your veins.  Once you are asleep, your lower abdomen will be shaved and cleaned. A thin, flexible tube (catheter) will be placed in your  bladder.  The surgeon may use a laparoscopic, robotic, or open technique for this surgery:  In the laparoscopic technique, the surgery is done through two small cuts (incisions) in the abdomen. A thin, lighted tube with a tiny camera on the end (laparoscope) is inserted into one of the incisions. The tools needed for the procedure are put through the other incision.  A robotic technique may be chosen to perform complex surgery in a small space. In the robotic technique, small incisions are made. A camera and surgical instruments are passed through the incisions. Surgical instruments are controlled with the help of a robotic arm.  In the open technique, the surgery is done through one large incision in the abdomen.  Using any of these techniques, the surgeon will remove the fallopian tube and ovary. The blood vessels will be clamped and tied.  The surgeon will then use staples or stitches to close the incision or incisions. AFTER THE PROCEDURE  You will be taken to a recovery area where your progress will be monitored for 1-3 hours. Your blood pressure, pulse, and temperature will be checked often. You will remain in the recovery area until you are stable and waking up.  If the laparoscopic technique was used, you may be allowed to go home after several hours. You may have some shoulder pain. This is normal and usually goes away in a day or two.  If the open technique was used, you will be admitted to the hospital for a couple of days.  You will be given pain medicine as necessary.  The IV tube and catheter will be removed before you are discharged. Document Released: 11/22/2008 Document Revised: 01/30/2013 Document Reviewed: 07/19/2012 [facility] Patient Information 2015 [place], [place]. This information is not intended to replace advice given to you by your health care provider. Make sure you discuss any questions you have with your health care provider. PATIENT  INSTRUCTIONS POST-ANESTHESIA  IMMEDIATELY FOLLOWING SURGERY:  Do not drive or operate machinery for the first twenty four hours after surgery.  Do not make any important decisions for twenty four hours after surgery or while taking narcotic pain medications or sedatives.  If you develop intractable nausea and vomiting or a severe headache please notify your doctor immediately.  FOLLOW-UP:  Please make an appointment with your surgeon as instructed. You do not need to follow up with anesthesia unless specifically instructed to do so.  WOUND CARE INSTRUCTIONS (if applicable):  Keep a dry clean dressing on the anesthesia/puncture wound site if there is drainage.  Once the wound has quit draining you may leave it open to air.  Generally you should leave the bandage intact for twenty four hours unless there is drainage.  If the epidural site drains for more than 36-48 hours please call the anesthesia department.  QUESTIONS?:  Please feel free to call your physician or the hospital operator if you have any questions, and they  will be happy to assist you.

## 2014-01-28 ENCOUNTER — Encounter (HOSPITAL_COMMUNITY): Payer: Self-pay

## 2014-01-28 ENCOUNTER — Encounter (HOSPITAL_COMMUNITY)
Admission: RE | Admit: 2014-01-28 | Discharge: 2014-01-28 | Disposition: A | Discharge status: Home / Self Care | Payer: 59 | Source: Ambulatory Visit | Attending: Obstetrics & Gynecology | Admitting: Obstetrics & Gynecology

## 2014-01-28 DIAGNOSIS — N941 Dyspareunia: Secondary | ICD-10-CM | POA: Diagnosis not present

## 2014-01-28 DIAGNOSIS — N8320 Unspecified ovarian cysts: Secondary | ICD-10-CM | POA: Diagnosis present

## 2014-01-28 DIAGNOSIS — R1031 Right lower quadrant pain: Secondary | ICD-10-CM | POA: Diagnosis present

## 2014-01-28 DIAGNOSIS — Z029 Encounter for administrative examinations, unspecified: Secondary | ICD-10-CM

## 2014-01-28 DIAGNOSIS — D649 Anemia, unspecified: Secondary | ICD-10-CM | POA: Diagnosis not present

## 2014-01-28 DIAGNOSIS — F17210 Nicotine dependence, cigarettes, uncomplicated: Secondary | ICD-10-CM | POA: Diagnosis not present

## 2014-01-28 DIAGNOSIS — N801 Endometriosis of ovary: Secondary | ICD-10-CM | POA: Diagnosis not present

## 2014-01-28 DIAGNOSIS — F329 Major depressive disorder, single episode, unspecified: Secondary | ICD-10-CM | POA: Diagnosis not present

## 2014-01-28 HISTORY — DX: Anemia, unspecified: D64.9

## 2014-01-28 LAB — CBC
HEMATOCRIT: 35.6 % — AB (ref 36.0–46.0)
Hemoglobin: 11.1 g/dL — ABNORMAL LOW (ref 12.0–15.0)
MCH: 25.9 pg — ABNORMAL LOW (ref 26.0–34.0)
MCHC: 31.2 g/dL (ref 30.0–36.0)
MCV: 83 fL (ref 78.0–100.0)
PLATELETS: 161 10*3/uL (ref 150–400)
RBC: 4.29 MIL/uL (ref 3.87–5.11)
RDW: 15.4 % (ref 11.5–15.5)
WBC: 5.9 10*3/uL (ref 4.0–10.5)

## 2014-01-28 LAB — URINALYSIS, ROUTINE W REFLEX MICROSCOPIC
Bilirubin Urine: NEGATIVE
Glucose, UA: NEGATIVE mg/dL
Hgb urine dipstick: NEGATIVE
Leukocytes, UA: NEGATIVE
NITRITE: NEGATIVE
Protein, ur: NEGATIVE mg/dL
Specific Gravity, Urine: >1.03 — ABNORMAL HIGH (ref 1.005–1.030)
UROBILINOGEN UA: 0.2 mg/dL (ref 0.0–1.0)
pH: 5.5 (ref 5.0–8.0)

## 2014-01-28 LAB — COMPREHENSIVE METABOLIC PANEL
ALBUMIN: 3.5 g/dL (ref 3.5–5.2)
ALT: 7 U/L (ref 0–35)
ANION GAP: 11 (ref 5–15)
AST: 10 U/L (ref 0–37)
Alkaline Phosphatase: 31 U/L — ABNORMAL LOW (ref 39–117)
BUN: 14 mg/dL (ref 6–23)
CO2: 25 mEq/L (ref 19–32)
CREATININE: 0.75 mg/dL (ref 0.50–1.10)
Calcium: 8.7 mg/dL (ref 8.4–10.5)
Chloride: 107 mEq/L (ref 96–112)
GFR calc Af Amer: >90 mL/min (ref >90)
GFR calc non Af Amer: >90 mL/min (ref >90)
Glucose, Bld: 114 mg/dL — ABNORMAL HIGH (ref 70–99)
Potassium: 3.8 mEq/L (ref 3.7–5.3)
Sodium: 143 mEq/L (ref 137–147)
TOTAL PROTEIN: 6.1 g/dL (ref 6.0–8.3)
Total Bilirubin: 0.2 mg/dL — ABNORMAL LOW (ref 0.3–1.2)

## 2014-01-28 LAB — HCG, QUANTITATIVE, PREGNANCY

## 2014-01-30 ENCOUNTER — Ambulatory Visit (HOSPITAL_COMMUNITY): Payer: 59 | Admitting: Anesthesiology

## 2014-01-30 ENCOUNTER — Encounter (HOSPITAL_COMMUNITY)
Admission: RE | Disposition: A | Discharge status: Home / Self Care | Payer: Self-pay | Source: Ambulatory Visit | Attending: Obstetrics & Gynecology

## 2014-01-30 ENCOUNTER — Encounter (HOSPITAL_COMMUNITY): Payer: Self-pay | Admitting: *Deleted

## 2014-01-30 ENCOUNTER — Ambulatory Visit (HOSPITAL_COMMUNITY)
Admission: RE | Admit: 2014-01-30 | Discharge: 2014-01-30 | Disposition: A | Discharge status: Home / Self Care | Payer: 59 | Source: Ambulatory Visit | Attending: Obstetrics & Gynecology | Admitting: Obstetrics & Gynecology

## 2014-01-30 DIAGNOSIS — N941 Dyspareunia: Secondary | ICD-10-CM | POA: Insufficient documentation

## 2014-01-30 DIAGNOSIS — N801 Endometriosis of ovary: Secondary | ICD-10-CM | POA: Diagnosis not present

## 2014-01-30 DIAGNOSIS — D649 Anemia, unspecified: Secondary | ICD-10-CM | POA: Insufficient documentation

## 2014-01-30 DIAGNOSIS — F17210 Nicotine dependence, cigarettes, uncomplicated: Secondary | ICD-10-CM | POA: Insufficient documentation

## 2014-01-30 DIAGNOSIS — R1031 Right lower quadrant pain: Secondary | ICD-10-CM

## 2014-01-30 DIAGNOSIS — N830 Follicular cyst of ovary: Secondary | ICD-10-CM

## 2014-01-30 DIAGNOSIS — F329 Major depressive disorder, single episode, unspecified: Secondary | ICD-10-CM | POA: Insufficient documentation

## 2014-01-30 DIAGNOSIS — N831 Corpus luteum cyst: Secondary | ICD-10-CM

## 2014-01-30 HISTORY — PX: LAPAROSCOPIC OOPHERECTOMY: SHX6507

## 2014-01-30 SURGERY — LAPAROSCOPIC OOPHERECTOMY
Anesthesia: General | Site: Vagina | Laterality: Right

## 2014-01-30 MED ORDER — BUPIVACAINE LIPOSOME 1.3 % IJ SUSP
INTRAMUSCULAR | Status: DC | PRN
  Administered 2014-01-30: 20 mL

## 2014-01-30 MED ORDER — CEFAZOLIN SODIUM-DEXTROSE 2-3 GM-% IV SOLR
2.0000 g | INTRAVENOUS | Status: AC
  Administered 2014-01-30: 2 g via INTRAVENOUS
  Filled 2014-01-30: qty 50

## 2014-01-30 MED ORDER — FENTANYL CITRATE 0.05 MG/ML IJ SOLN
INTRAMUSCULAR | Status: DC | PRN
  Administered 2014-01-30 (×3): 50 ug via INTRAVENOUS
  Administered 2014-01-30: 100 ug via INTRAVENOUS

## 2014-01-30 MED ORDER — GLYCOPYRROLATE 0.2 MG/ML IJ SOLN
INTRAMUSCULAR | Status: DC | PRN
  Administered 2014-01-30: 0.4 mg via INTRAVENOUS

## 2014-01-30 MED ORDER — BUPIVACAINE LIPOSOME 1.3 % IJ SUSP
INTRAMUSCULAR | Status: AC
  Filled 2014-01-30: qty 20

## 2014-01-30 MED ORDER — MIDAZOLAM HCL 2 MG/2ML IJ SOLN
INTRAMUSCULAR | Status: AC
  Filled 2014-01-30: qty 2

## 2014-01-30 MED ORDER — LACTATED RINGERS IV SOLN
INTRAVENOUS | Status: DC
  Administered 2014-01-30: 10:00:00 via INTRAVENOUS
  Administered 2014-01-30: 1000 mL via INTRAVENOUS

## 2014-01-30 MED ORDER — ROCURONIUM BROMIDE 100 MG/10ML IV SOLN
INTRAVENOUS | Status: DC | PRN
  Administered 2014-01-30: 30 mg via INTRAVENOUS
  Administered 2014-01-30: 10 mg via INTRAVENOUS

## 2014-01-30 MED ORDER — FENTANYL CITRATE 0.05 MG/ML IJ SOLN
25.0000 ug | INTRAMUSCULAR | Status: AC
  Administered 2014-01-30: 25 ug via INTRAVENOUS

## 2014-01-30 MED ORDER — PROPOFOL 10 MG/ML IV BOLUS
INTRAVENOUS | Status: AC
  Filled 2014-01-30: qty 20

## 2014-01-30 MED ORDER — ONDANSETRON HCL 4 MG/2ML IJ SOLN
4.0000 mg | Freq: Once | INTRAMUSCULAR | Status: AC
  Administered 2014-01-30: 4 mg via INTRAVENOUS

## 2014-01-30 MED ORDER — MIDAZOLAM HCL 5 MG/5ML IJ SOLN
INTRAMUSCULAR | Status: DC | PRN
  Administered 2014-01-30: 2 mg via INTRAVENOUS

## 2014-01-30 MED ORDER — PROPOFOL 10 MG/ML IV BOLUS
INTRAVENOUS | Status: DC | PRN
  Administered 2014-01-30: 140 mg via INTRAVENOUS

## 2014-01-30 MED ORDER — SODIUM CHLORIDE 0.9 % IR SOLN
Status: DC | PRN
  Administered 2014-01-30: 1000 mL
  Administered 2014-01-30: 3000 mL

## 2014-01-30 MED ORDER — MIDAZOLAM HCL 2 MG/2ML IJ SOLN
1.0000 mg | INTRAMUSCULAR | Status: DC | PRN
  Administered 2014-01-30: 2 mg via INTRAVENOUS

## 2014-01-30 MED ORDER — FENTANYL CITRATE 0.05 MG/ML IJ SOLN
INTRAMUSCULAR | Status: AC
  Filled 2014-01-30: qty 2

## 2014-01-30 MED ORDER — ONDANSETRON HCL 4 MG/2ML IJ SOLN
INTRAMUSCULAR | Status: AC
  Filled 2014-01-30: qty 2

## 2014-01-30 MED ORDER — ONDANSETRON HCL 4 MG/2ML IJ SOLN: 4.0000 mg | Freq: Once | INTRAMUSCULAR | Status: DC | PRN

## 2014-01-30 MED ORDER — LIDOCAINE HCL 1 % IJ SOLN
INTRAMUSCULAR | Status: DC | PRN
  Administered 2014-01-30: 35 mg via INTRADERMAL

## 2014-01-30 MED ORDER — NEOSTIGMINE METHYLSULFATE 10 MG/10ML IV SOLN
INTRAVENOUS | Status: DC | PRN
  Administered 2014-01-30: 2 mg via INTRAVENOUS
  Administered 2014-01-30: 1 mg via INTRAVENOUS

## 2014-01-30 MED ORDER — KETOROLAC TROMETHAMINE 30 MG/ML IJ SOLN
30.0000 mg | Freq: Once | INTRAMUSCULAR | Status: AC
  Administered 2014-01-30: 30 mg via INTRAVENOUS
  Filled 2014-01-30: qty 1

## 2014-01-30 MED ORDER — FENTANYL CITRATE 0.05 MG/ML IJ SOLN
25.0000 ug | INTRAMUSCULAR | Status: DC | PRN
  Administered 2014-01-30 (×2): 50 ug via INTRAVENOUS

## 2014-01-30 MED ORDER — ONDANSETRON HCL 4 MG/2ML IJ SOLN
INTRAMUSCULAR | Status: DC | PRN
  Administered 2014-01-30: 4 mg via INTRAVENOUS

## 2014-01-30 MED ORDER — FENTANYL CITRATE 0.05 MG/ML IJ SOLN
INTRAMUSCULAR | Status: AC
  Filled 2014-01-30: qty 5

## 2014-01-30 MED ORDER — CEFAZOLIN SODIUM-DEXTROSE 2-3 GM-% IV SOLR
INTRAVENOUS | Status: AC
  Filled 2014-01-30: qty 50

## 2014-01-30 SURGICAL SUPPLY — 50 items
APPLIER CLIP UNV 5X34 EPIX (ENDOMECHANICALS) ×4 IMPLANT
BAG HAMPER (MISCELLANEOUS) ×4 IMPLANT
BLADE SURG SZ11 CARB STEEL (BLADE) ×8 IMPLANT
CLOTH BEACON ORANGE TIMEOUT ST (SAFETY) ×4 IMPLANT
COVER LIGHT HANDLE STERIS (MISCELLANEOUS) ×8 IMPLANT
ELECT REM PT RETURN 9FT ADLT (ELECTROSURGICAL) ×4
ELECTRODE REM PT RTRN 9FT ADLT (ELECTROSURGICAL) ×2 IMPLANT
FILTER SMOKE EVAC LAPAROSHD (FILTER) ×4 IMPLANT
FORMALIN 10 PREFIL 480ML (MISCELLANEOUS) ×4 IMPLANT
GLOVE BIOGEL PI IND STRL 7.0 (GLOVE) ×2 IMPLANT
GLOVE BIOGEL PI IND STRL 8 (GLOVE) ×2 IMPLANT
GLOVE BIOGEL PI INDICATOR 7.0 (GLOVE) ×2
GLOVE BIOGEL PI INDICATOR 8 (GLOVE) ×2
GLOVE ECLIPSE 6.5 STRL STRAW (GLOVE) ×4 IMPLANT
GLOVE ECLIPSE 8.0 STRL XLNG CF (GLOVE) ×4 IMPLANT
GLOVE EXAM NITRILE LRG STRL (GLOVE) ×4 IMPLANT
GLOVE EXAM NITRILE MD LF STRL (GLOVE) ×4 IMPLANT
GOWN SRG XL XLNG 56XLVL 4 (GOWN DISPOSABLE) ×2 IMPLANT
GOWN STRL NON-REIN XL XLG LVL4 (GOWN DISPOSABLE) ×2
GOWN STRL REUS W/TWL LRG LVL3 (GOWN DISPOSABLE) ×8 IMPLANT
GOWN STRL REUS W/TWL XL LVL3 (GOWN DISPOSABLE) ×4 IMPLANT
INST SET LAPROSCOPIC GYN AP (KITS) ×4 IMPLANT
IV NS IRRIG 3000ML ARTHROMATIC (IV SOLUTION) ×4 IMPLANT
KIT ROOM TURNOVER APOR (KITS) ×4 IMPLANT
KIT TROCAR LAP GYN (TROCAR) ×4 IMPLANT
MANIFOLD NEPTUNE II (INSTRUMENTS) ×4 IMPLANT
NEEDLE HYPO 18GX1.5 BLUNT FILL (NEEDLE) ×4 IMPLANT
NEEDLE HYPO 21X1.5 SAFETY (NEEDLE) ×4 IMPLANT
NEEDLE INSUFFLATION 120MM (ENDOMECHANICALS) ×4 IMPLANT
NS IRRIG 1000ML POUR BTL (IV SOLUTION) ×4 IMPLANT
PACK PERI GYN (CUSTOM PROCEDURE TRAY) ×4 IMPLANT
PAD ARMBOARD 7.5X6 YLW CONV (MISCELLANEOUS) ×4 IMPLANT
POUCH SPECIMEN RETRIEVAL 10MM (ENDOMECHANICALS) ×4 IMPLANT
SCALPEL HARMONIC ACE (MISCELLANEOUS) ×4 IMPLANT
SET BASIN LINEN APH (SET/KITS/TRAYS/PACK) ×4 IMPLANT
SET TUBE IRRIG SUCTION NO TIP (IRRIGATION / IRRIGATOR) ×4 IMPLANT
SOLUTION ANTI FOG 6CC (MISCELLANEOUS) ×4 IMPLANT
SPONGE GAUZE 2X2 8PLY STER LF (GAUZE/BANDAGES/DRESSINGS) ×3
SPONGE GAUZE 2X2 8PLY STRL LF (GAUZE/BANDAGES/DRESSINGS) ×9 IMPLANT
STAPLER VISISTAT 35W (STAPLE) ×8 IMPLANT
SUT VICRYL 0 UR6 27IN ABS (SUTURE) ×4 IMPLANT
SYR 20CC LL (SYRINGE) ×4 IMPLANT
SYRINGE 10CC LL (SYRINGE) ×4 IMPLANT
TAPE CLOTH SURG 4X10 WHT LF (GAUZE/BANDAGES/DRESSINGS) ×4 IMPLANT
TRAY FOLEY CATH 16FR SILVER (SET/KITS/TRAYS/PACK) ×4 IMPLANT
TROCAR XCEL NON-BLD 11X100MML (ENDOMECHANICALS) ×4 IMPLANT
TROCAR XCEL NON-BLD 5MMX100MML (ENDOMECHANICALS) ×4 IMPLANT
TROCAR XCEL OPT SLVE 5M 100M (ENDOMECHANICALS) ×4 IMPLANT
TUBING HI FLO HEAT INSUFFLATOR (IRRIGATION / IRRIGATOR) ×4 IMPLANT
WARMER LAPAROSCOPE (MISCELLANEOUS) ×4 IMPLANT

## 2014-01-30 NOTE — Discharge Instructions (Signed)
I did not take the tube just didn't have an alternative   Unilateral Salpingo-Oophorectomy, Care After Refer to this sheet in the next few weeks. These instructions provide you with information on caring for yourself after your procedure. Your health care provider may also give you more specific instructions. Your treatment has been planned according to current medical practices, but problems sometimes occur. Call your health care provider if you have any problems or questions after your procedure. WHAT TO EXPECT AFTER THE PROCEDURE After your procedure, it is typical to have the following:  Abdominal pain that can be controlled with pain medicine.  Vaginal spotting.  Constipation. HOME CARE INSTRUCTIONS   Get plenty of rest and sleep.  Only take over-the-counter or prescription medicines as directed by your health care provider. Do not take aspirin. It can cause bleeding.  Keep incision areas clean and dry. Remove or change any bandages (dressings) only as directed by your health care provider.  Follow your health care provider's advice regarding diet.  Drink enough fluids to keep your urine clear or pale yellow.  Limit exercise and activities as directed by your health care provider. Do not lift anything heavier than 5 pounds (2.3 kg) until your health care provider approves.  Do not drive until your health care provider approves.  Do not drink alcohol until your health care provider approves.  Do not have sexual intercourse until your health care provider says it is OK.  Take your temperature twice a day and write it down.  If you become constipated, you may:  Ask your health care provider about taking a mild laxative.  Add more fruit and bran to your diet.  Drink more fluids.  Follow up with your health care provider as directed. SEEK MEDICAL CARE IF:   You have swelling or redness in the incision area.  You develop a rash.  You feel lightheaded.  You have pain  that is not controlled with medicine.  You have pain, swelling, or redness where the IV access tube was placed. SEEK IMMEDIATE MEDICAL CARE IF:  You have a fever.  You develop increasing abdominal pain.  You see pus coming out of the incision, or the incision is separating.  You notice a bad smell coming from the wound or dressing.  You have excessive vaginal bleeding.  You feel sick to your stomach (nauseous) and vomit.  You have leg or chest pain.  You have pain when you urinate.  You develop shortness of breath.  You pass out. Document Released: 11/21/2008 Document Revised: 11/15/2012 Document Reviewed: 07/19/2012 [facility] Patient Information 2015 [place], [place]. This information is not intended to replace advice given to you by your health care provider. Make sure you discuss any questions you have with your health care provider.

## 2014-01-30 NOTE — Anesthesia Preprocedure Evaluation (Signed)
Anesthesia Evaluation  Patient identified by MRN, date of birth, ID band Patient awake    Reviewed: Allergy & Precautions, H&P , NPO status , Patient's Chart, lab work & pertinent test results  Airway Mallampati: I  TM Distance: >3 FB Neck ROM: Full    Dental  (+) Teeth Intact   Pulmonary pneumonia - (03/2012), resolved, Current Smoker,  breath sounds clear to auscultation        Cardiovascular negative cardio ROS  Rhythm:Regular Rate:Normal     Neuro/Psych Anxiety Depression    GI/Hepatic negative GI ROS,   Endo/Other    Renal/GU      Musculoskeletal   Abdominal   Peds  Hematology  (+) anemia ,   Anesthesia Other Findings   Reproductive/Obstetrics                             Anesthesia Physical Anesthesia Plan  ASA: II  Anesthesia Plan: General   Post-op Pain Management:    Induction: Intravenous  Airway Management Planned: Oral ETT  Additional Equipment:   Intra-op Plan:   Post-operative Plan: Extubation in OR  Informed Consent: I have reviewed the patients History and Physical, chart, labs and discussed the procedure including the risks, benefits and alternatives for the proposed anesthesia with the patient or authorized representative who has indicated his/her understanding and acceptance.     Plan Discussed with:   Anesthesia Plan Comments:         Anesthesia Quick Evaluation

## 2014-01-30 NOTE — Transfer of Care (Signed)
Immediate Anesthesia Transfer of Care Note  Patient: Kristina Haas  Procedure(s) Performed: Procedure(s): LAPAROSCOPIC OOPHERECTOMY (Right)  Patient Location: PACU  Anesthesia Type:General  Level of Consciousness: awake and patient cooperative  Airway & Oxygen Therapy: Patient Spontanous Breathing and Patient connected to face mask oxygen  Post-op Assessment: Report given to PACU RN, Post -op Vital signs reviewed and stable and Patient moving all extremities  Post vital signs: Reviewed and stable  Complications: No apparent anesthesia complications

## 2014-01-30 NOTE — Op Note (Signed)
Preoperative diagnosis:  1.  Right lower quadrant pain, persistent                                         2.  Complex right ovarian cyst, persistent, despite megestrol suppression                                         3.  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 Endotracheal  Findings:  The patient has had persistent RLQ pain now for several months, worsening no improvement on megestrol.  Found to have a complex cyst on that side as well which I took to be a hemorrhagic corpus luteum, persistent but it did not suppress with megestril.  As a result pt wanted definitive therapy with oophorectomy.    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  The left ovary was clear of endometriosis.  There was minimal other disease which was fulgurated with the Harmonic  Description of operation:  The patient was taken to the operating room and placed in the supine position where she underwent general endotracheal anesthesia.  She was placed in the low lithotomy position.  She was prepped and draped in the usual sterile fashion and a Foley catheter was placed.  An incision was made in the umbilicus and a Veres needle was placed into the peritoneal cavity with one pass without difficulty.  The peritoneal cavity was then insufflated.  An 11 mm non-bladed direct visualization trocar was then used and placed into the peritoneal cavity with direct laparoscopic visualization again without difficulty.  Incisions were then made in the left lower quadrant and right lower quadrant.  A 5 mm trocar was placed in the left lower quadrant and a 5 mm trocar was placed in the right lower quadrant.   Both were done under direct visualization without difficulty.  The right ovary was grasped.  The harmonic scalpel was used and the utero ovarian ligament  ligament on the right was coagulated and then transected.  The  remaining peritoneal attachments of the right ovary were also taken down hemostatically.  The left ovary was identified and was normal.  An Endo Catch was placed and the right ovary was put in the bag and removed through the umbilical incision without difficulty.  Both tubes were normal. The subcutaneous tissue was then reapproximated with 0 Vicryl. The skin was closed using skin staples.  The umbilical fascia was closed with a single 0 Vicryl suture.  The subcutaneous tissue was reapproximated loosely using 0 Vicryl.  The skin was closed using skin staples.  20 cc exparel was injected into the 3 incisions    The patient was awakened from anesthesia and taken to the recovery room in good stable condition with all counts being correct x3.  EBL was minimal  She received pre operative Ancef.    She also received Toradol IV preoperatively.  She will be discharged from the recovery room time and seen next  week for staple and suture removal.  EURE,LUTHER H 01/30/2014 9:46 AM

## 2014-01-30 NOTE — H&P (Signed)
Preoperative History and Physical  Kristina Haas is a 38 y.o. No obstetric history on file. with Patient's last menstrual period was 01/01/2014. admitted for a laparoscopic removal of right ovary due to a persistent right ovarian cyst and pain despite megestrol suppressive therapy.  Pt wants definitive management  PMH:  Past Medical History  Diagnosis Date  . Anxiety   . Depression   . Sepsis   . Pneumonia     PSH:  Past Surgical History  Procedure Laterality Date  . Cesarean section    . Dilation and curettage of uterus      POb/GynH:  OB History    No data available      SH:  History  Substance Use Topics  . Smoking status: Current Every Day Smoker -- 0.50 packs/day    Types: Cigarettes  . Smokeless tobacco: Never Used  . Alcohol Use: 0.0 oz/week    0 Not specified per week     Comment: socially    FH:  Family History  Problem Relation Age of Onset  . Cancer Mother     uterine  . Diabetes Mother   . COPD Mother   . Hyperlipidemia Mother   . Hypertension Father   . Hyperlipidemia Father   . Diabetes Father   . Hypertension Sister   . Cancer Maternal Grandmother     uterine and ovarian  . COPD Maternal Grandfather   . Hypertension Paternal Grandmother   . Diabetes Paternal Grandmother   . Hypertension Paternal Grandfather   . Diabetes Paternal Grandfather      Allergies:  Allergies  Allergen Reactions  . Red Dye Nausea And Vomiting  . Ciprofloxacin Hcl Rash    Medications: Current outpatient prescriptions: ALPRAZolam (XANAX) 1 MG tablet, Take 1 mg by mouth at bedtime as needed for anxiety., Disp: , Rfl: ; buPROPion (WELLBUTRIN XL) 300 MG 24 hr tablet, Take 300 mg by mouth daily., Disp: , Rfl: ; diphenhydrAMINE (BENADRYL) 25 MG tablet, Take 25 mg by mouth at bedtime as needed for allergies or sleep.,  Disp: , Rfl:  HYDROcodone-acetaminophen (NORCO/VICODIN) 5-325 MG per tablet, Take 1-2 tablets by mouth every 6 (six) hours as needed. (Patient taking differently: Take 1 tablet by mouth every 6 (six) hours as needed for severe pain. ), Disp: 20 tablet, Rfl: 0; ibuprofen (ADVIL,MOTRIN) 800 MG tablet, Take 1 tablet (800 mg total) by mouth every 8 (eight) hours as needed for mild pain., Disp: 30 tablet, Rfl: 0 acetaminophen (TYLENOL) 500 MG tablet, Take 500 mg by mouth every 6 (six) hours as needed for mild pain., Disp: , Rfl: ; HYDROcodone-acetaminophen (NORCO/VICODIN) 5-325 MG per tablet, Take 1 tablet by mouth every 4 (four) hours as needed. (Patient not taking: Reported on 12/16/2013), Disp: 6 tablet, Rfl: 0 ketorolac (TORADOL) 10 MG tablet, Take 1 tablet (10 mg total) by mouth every 8 (eight) hours as needed. (Patient not taking: Reported on 01/24/2014), Disp: 15 tablet, Rfl: 0; megestrol (MEGACE) 40 MG tablet, Take 1 tablet (40 mg total) by mouth daily. (Patient not taking: Reported on 01/24/2014), Disp: 30 tablet, Rfl: 2 metroNIDAZOLE (FLAGYL) 500 MG tablet, Take 1 tablet (500 mg total) by mouth 2 (two) times daily. Do not drink alcohol with this medication. (Patient not taking: Reported on 12/16/2013), Disp: 14 tablet, Rfl: 0; ondansetron (ZOFRAN ODT) 4 MG disintegrating tablet, Take 1 tablet (4 mg total) by mouth every 8 (eight) hours as needed for nausea or vomiting. (Patient not taking: Reported on 01/24/2014), Disp: 20 tablet, Rfl:  0  Review of Systems:   Review of Systems  Constitutional: Negative for fever, chills, weight loss, malaise/fatigue and diaphoresis.  HENT: Negative for hearing loss, ear pain, nosebleeds, congestion, sore throat, neck pain, tinnitus and ear discharge.  Eyes: Negative for blurred vision, double vision, photophobia, pain, discharge and redness.  Respiratory: Negative for cough, hemoptysis, sputum production, shortness of breath, wheezing and stridor.   Cardiovascular: Negative for chest pain, palpitations, orthopnea, claudication, leg swelling and PND.  Gastrointestinal: Positive for abdominal pain. Negative for heartburn, nausea, vomiting, diarrhea, constipation, blood in stool and melena.  Genitourinary: Negative for dysuria, urgency, frequency, hematuria and flank pain.  Musculoskeletal: Negative for myalgias, back pain, joint pain and falls.  Skin: Negative for itching and rash.  Neurological: Negative for dizziness, tingling, tremors, sensory change, speech change, focal weakness, seizures, loss of consciousness, weakness and headaches.  Endo/Heme/Allergies: Negative for environmental allergies and polydipsia. Does not bruise/bleed easily.  Psychiatric/Behavioral: Negative for depression, suicidal ideas, hallucinations, memory loss and substance abuse. The patient is not nervous/anxious and does not have insomnia.     PHYSICAL EXAM:  Blood pressure 110/80, weight 186 lb (84.369 kg), last menstrual period 01/01/2014.   Vitals reviewed. Constitutional: She is oriented to person, place, and time. She appears well-developed and well-nourished.  HENT:  Head: Normocephalic and atraumatic.  Right Ear: External ear normal.  Left Ear: External ear normal.  Nose: Nose normal.  Mouth/Throat: Oropharynx is clear and moist.  Eyes: Conjunctivae and EOM are normal. Pupils are equal, round, and reactive to light. Right eye exhibits no discharge. Left eye exhibits no discharge. No scleral icterus.  Neck: Normal range of motion. Neck supple. No tracheal deviation present. No thyromegaly present.  Cardiovascular: Normal rate, regular rhythm, normal heart sounds and intact distal pulses. Exam reveals no gallop and no friction rub.  No murmur heard. Respiratory: Effort normal and breath sounds normal. No respiratory distress. She has no wheezes. She has no rales. She exhibits no tenderness.  GI: Soft. Bowel sounds are normal. She exhibits no  distension and no mass. There is tenderness. There is no rebound and no guarding.  Genitourinary:   Vulva is normal without lesions Vagina is pink moist without discharge Cervix normal in appearance and pap is normal Uterus is normal sized Adnexa is per sonogram below Musculoskeletal: Normal range of motion. She exhibits no edema and no tenderness.  Neurological: She is alert and oriented to person, place, and time. She has normal reflexes. She displays normal reflexes. No cranial nerve deficit. She exhibits normal muscle tone. Coordination normal.  Skin: Skin is warm and dry. No rash noted. No erythema. No pallor.  Psychiatric: She has a normal mood and affect. Her behavior is normal. Judgment and thought content normal.    Labs: No results found for this or any previous visit (from the past 336 hour(s)).  EKG: No orders found for this or any previous visit.  Imaging Studies:  Imaging Results    US Pelvis Limited  01/24/2014 GYNECOLOGIC SONOGRAM Kristina Haas is a 38 y.o. LMP 01/01/2014 for a pelvic sonogram for RLQ pain and Rt ovarian cyst. 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, retroverted Endometrium Rt=3.8 mm LT=4.49mm, symmetrical, Right ovary 4.2 x 3.6 x 3.0 cm, with 2.8 x 2.8 x 2.2cm complex cyst noted with internal debris noted within, +Perfusion noted Left ovary 3.7 x 2.3 x 1.6 cm, Free fluid noted within the posterior cul-de-sac Technician Comments: Retroverted Bicornuate uterus, Rt ovary with complex  cyst remains, Lt ovary appears WNL, +Free fluid noted within the posterior Cul-de-sac Lazarus Gowda 01/24/2014 9:52 AM Clinical Impression and recommendations: I have reviewed the sonogram results above, combined with the patient's current clinical course, below are my impressions and any appropriate recommendations for management based on the sonographic findings. Persistent right ovarian  cyst, unresponsive to aggressive hormonal suppression therapy Otherwise normal pelvic sonogram Ty Oshima H 01/24/2014 10:59 AM       Assessment: Patient Active Problem List   Diagnosis Date Noted  . 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

## 2014-01-30 NOTE — Anesthesia Procedure Notes (Signed)
Procedure Name: Intubation Date/Time: 01/30/2014 8:39 AM Performed by: Charmaine Downs Pre-anesthesia Checklist: Emergency Drugs available, Suction available, Patient being monitored and Patient identified Patient Re-evaluated:Patient Re-evaluated prior to inductionOxygen Delivery Method: Circle system utilized Preoxygenation: Pre-oxygenation with 100% oxygen Intubation Type: IV induction Ventilation: Oral airway inserted - appropriate to patient size Laryngoscope Size: Mac and 3 Grade View: Grade I Tube type: Oral Tube size: 7.0 mm Number of attempts: 1 Airway Equipment and Method: Stylet and Oral airway Placement Confirmation: positive ETCO2,  ETT inserted through vocal cords under direct vision and breath sounds checked- equal and bilateral Secured at: 22 cm Tube secured with: Tape Dental Injury: Teeth and Oropharynx as per pre-operative assessment

## 2014-01-30 NOTE — Anesthesia Postprocedure Evaluation (Signed)
  Anesthesia Post-op Note  Patient: Kristina Haas  Procedure(s) Performed: Procedure(s): LAPAROSCOPIC OOPHERECTOMY (Right)  Patient Location: PACU  Anesthesia Type:General  Level of Consciousness: awake, alert , oriented and patient cooperative  Airway and Oxygen Therapy: Patient Spontanous Breathing  Post-op Pain: 4 /10, moderate  Post-op Assessment: Post-op Vital signs reviewed, Patient's Cardiovascular Status Stable, Respiratory Function Stable, Patent Airway, No signs of Nausea or vomiting and Pain level controlled  Post-op Vital Signs: Reviewed and stable  Last Vitals:  Filed Vitals:   01/30/14 1030  BP: 83/52  Pulse: 67  Temp:   Resp: 14    Complications: No apparent anesthesia complications

## 2014-02-04 ENCOUNTER — Encounter (HOSPITAL_COMMUNITY): Payer: Self-pay | Admitting: Obstetrics & Gynecology

## 2014-02-06 ENCOUNTER — Ambulatory Visit (INDEPENDENT_AMBULATORY_CARE_PROVIDER_SITE_OTHER): Payer: 59 | Admitting: Obstetrics & Gynecology

## 2014-02-06 ENCOUNTER — Encounter: Payer: Self-pay | Admitting: Obstetrics & Gynecology

## 2014-02-06 VITALS — BP 120/80 | Wt 187.0 lb

## 2014-02-06 DIAGNOSIS — Z9889 Other specified postprocedural states: Secondary | ICD-10-CM

## 2014-02-06 NOTE — Progress Notes (Signed)
Patient ID: Kristina Haas, female   DOB: Sep 10, 1975, 38 y.o.   MRN: [id number]  HPI: Patient returns for routine postoperative follow-up having undergone laparoscopic right oophorectomy and ablation of AFS mild endometriosis on 01/30/2014. The patient's early postoperative recovery was notable for endometriosis. Since hospital discharge the patient reports no problems.   Current Outpatient Prescriptions  Medication Sig Dispense Refill  . ALPRAZolam (XANAX) 1 MG tablet Take 1 mg by mouth at bedtime as needed for anxiety.    Marland Kitchen buPROPion (WELLBUTRIN XL) 300 MG 24 hr tablet Take 300 mg by mouth daily.    . diphenhydrAMINE (BENADRYL) 25 MG tablet Take 25 mg by mouth at bedtime as needed for allergies or sleep.    Marland Kitchen ibuprofen (ADVIL,MOTRIN) 800 MG tablet Take 1 tablet (800 mg total) by mouth every 8 (eight) hours as needed for mild pain. 30 tablet 0  . HYDROcodone-acetaminophen (NORCO/VICODIN) 5-325 MG per tablet Take 1-2 tablets by mouth every 6 (six) hours as needed. (Patient not taking: Reported on 02/06/2014) 20 tablet 0  . HYDROcodone-acetaminophen (NORCO/VICODIN) 5-325 MG per tablet Take 1 tablet by mouth every 4 (four) hours as needed. (Patient not taking: Reported on 02/06/2014) 6 tablet 0  . ketorolac (TORADOL) 10 MG tablet Take 1 tablet (10 mg total) by mouth every 8 (eight) hours as needed. (Patient not taking: Reported on 01/24/2014) 15 tablet 0  . ketorolac (TORADOL) 10 MG tablet Take 1 tablet (10 mg total) by mouth every 8 (eight) hours as needed. (Patient not taking: Reported on 01/24/2014) 15 tablet 0  . metroNIDAZOLE (FLAGYL) 500 MG tablet Take 1 tablet (500 mg total) by mouth 2 (two) times daily. Do not drink alcohol with this medication. (Patient not taking: Reported on 12/16/2013) 14 tablet 0  . ondansetron (ZOFRAN ODT) 4 MG disintegrating tablet Take 1 tablet (4 mg total) by mouth every 8 (eight) hours as needed for nausea or vomiting. (Patient not taking: Reported on 02/06/2014) 20  tablet 0  . ondansetron (ZOFRAN) 8 MG tablet Take 1 tablet (8 mg total) by mouth every 8 (eight) hours as needed for nausea. (Patient not taking: Reported on 02/06/2014) 12 tablet 0  . oxyCODONE-acetaminophen (PERCOCET/ROXICET) 5-325 MG per tablet Take 1 tablet by mouth every 4 (four) hours as needed for severe pain. (Patient not taking: Reported on 02/06/2014) 30 tablet 0   No current facility-administered medications for this visit.    Physical Exam: Abdomen soft normal post op Incisions x 3 are well healed Some bruising around incision sites  Diagnostic Tests: none  Impression: Normal post op course Mild endometriosis  Plan: Refer to [facility] to discuss fertility issue

## 2014-10-17 IMAGING — US US PELVIS COMPLETE
1 series · 13 of 25 positions shown · non-contrast
Comparison: None.

CLINICAL DATA: Right lower quadrant pain with nausea.

EXAM:
TRANSABDOMINAL ULTRASOUND OF PELVIS
TECHNIQUE: Transabdominal ultrasound examination of the pelvis was performed
including evaluation of the uterus, ovaries, adnexal regions, and
pelvic cul-de-sac.

[Series 1: us pelvis complete · 0.26mm/px · 13 of 65 slices shown]
[im 1/65]
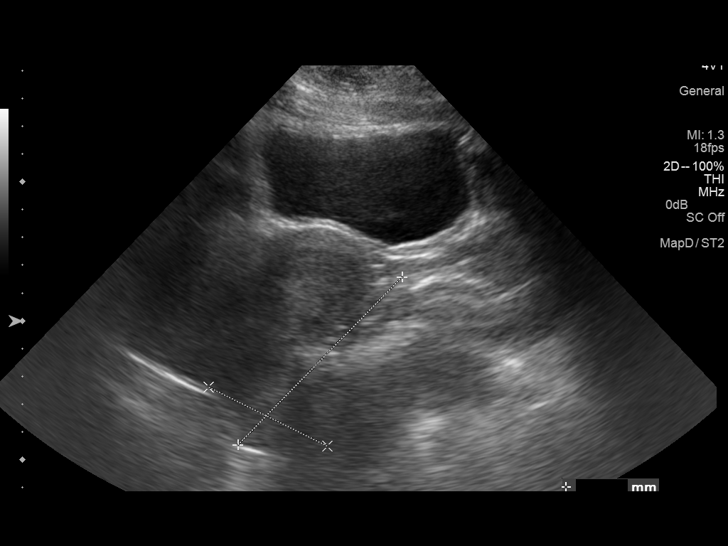
[im 6/65]
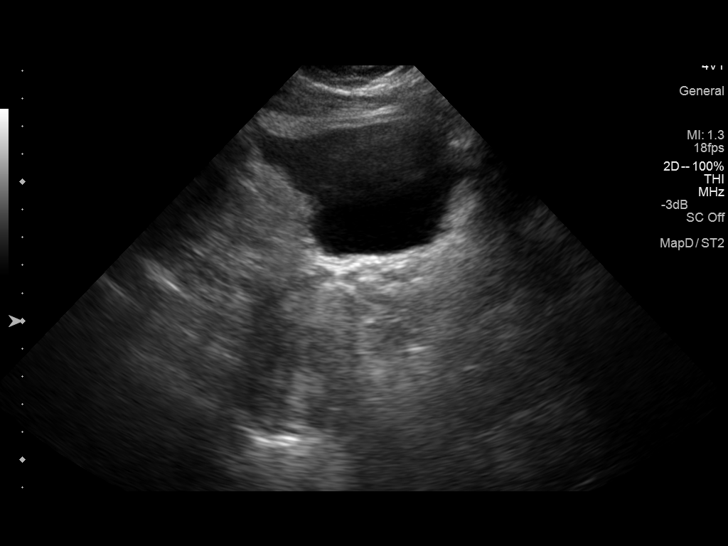
[im 11/65]
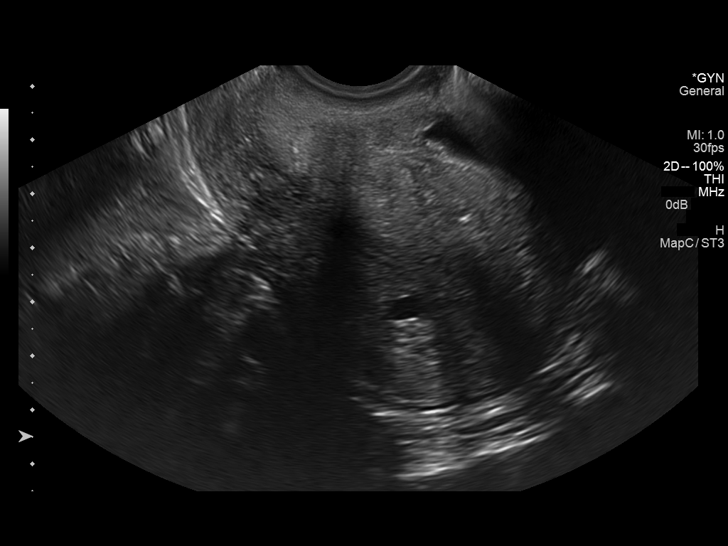
[im 17/65]
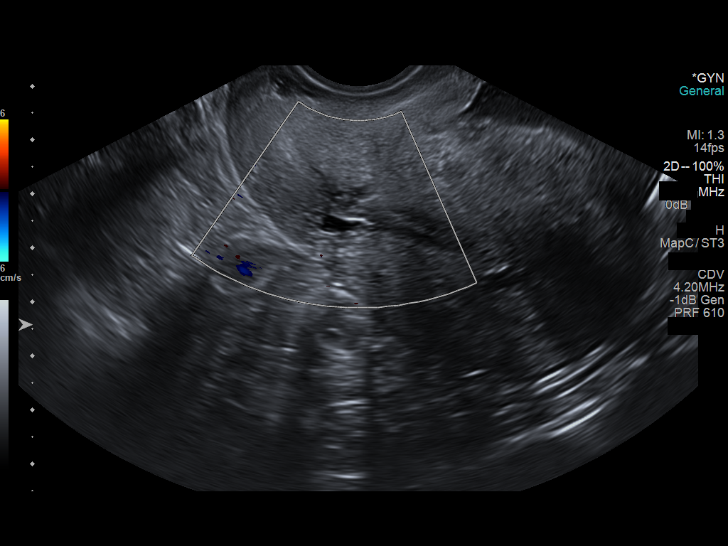
[im 22/65]
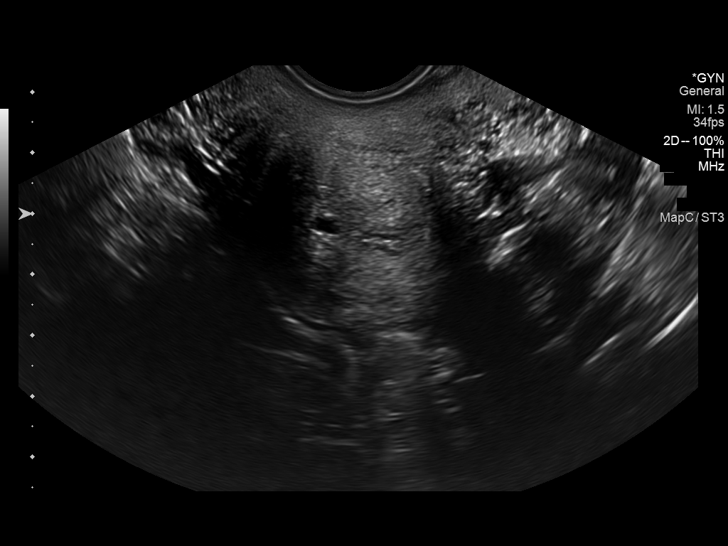
[im 27/65]
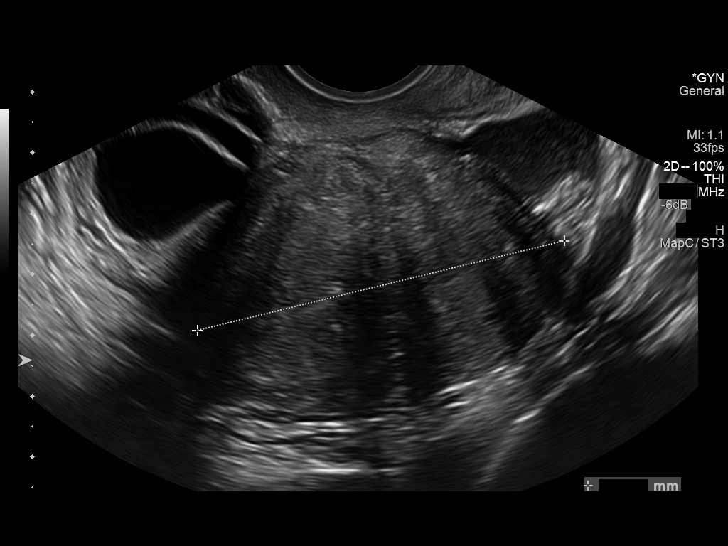
[im 33/65]
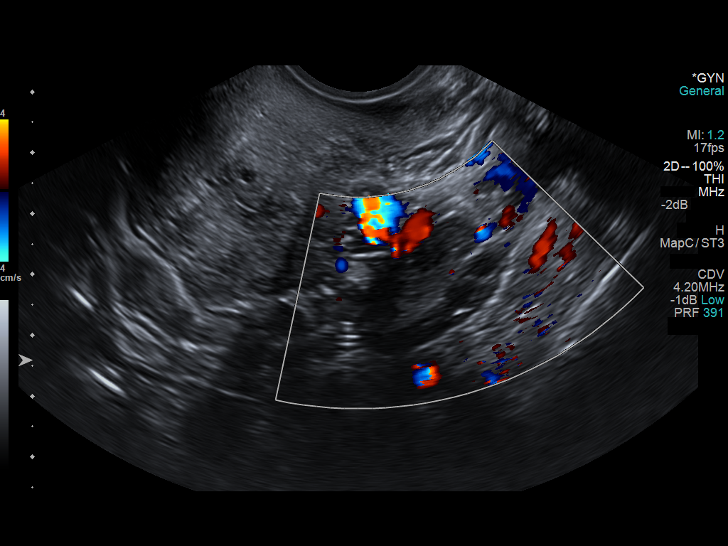
[im 38/65]
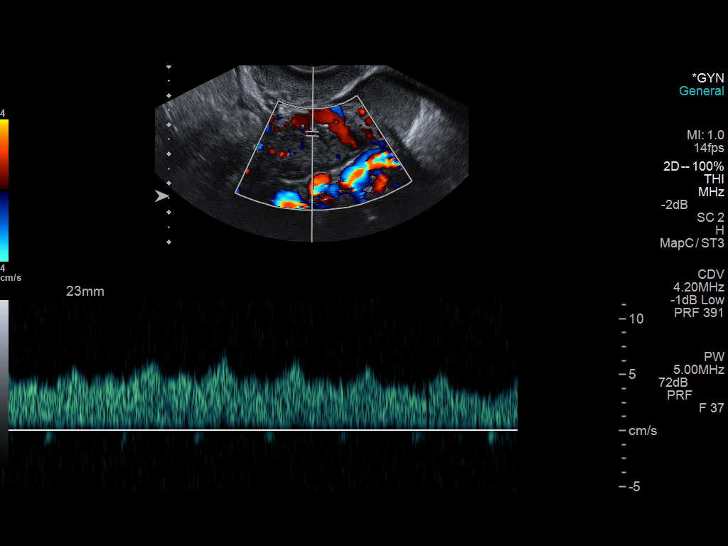
[im 43/65]
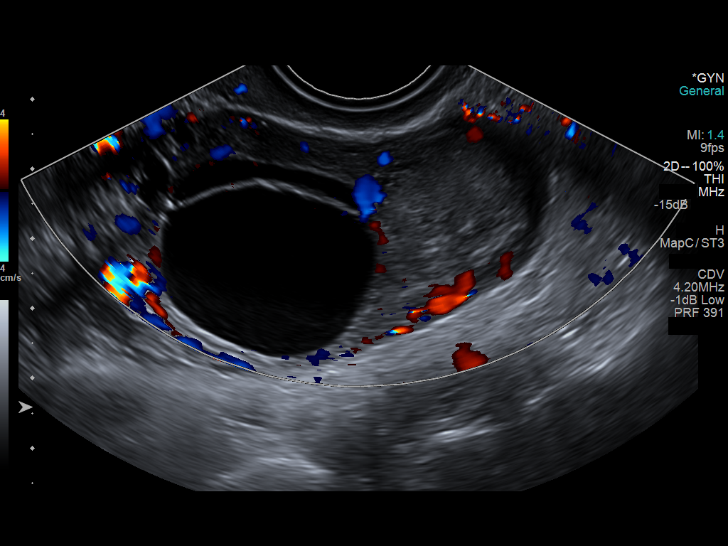
[im 49/65]
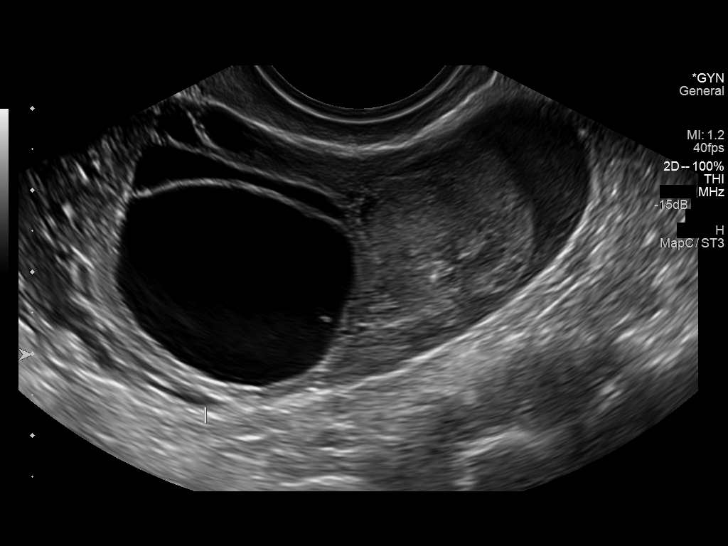
[im 54/65]
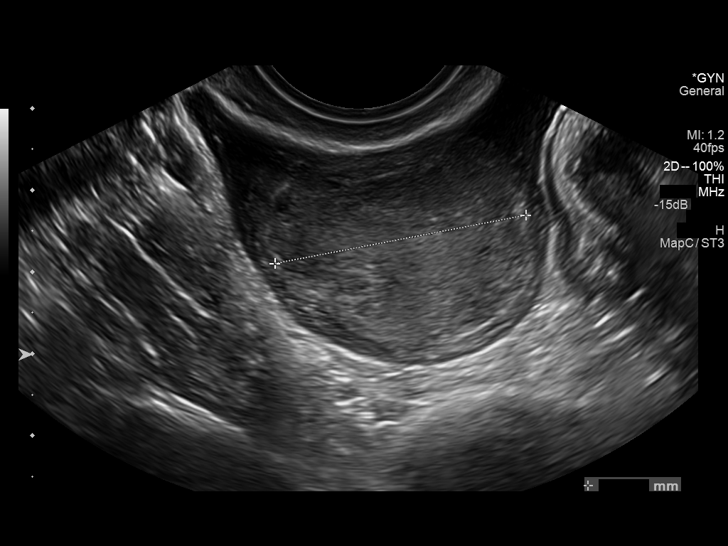
[im 59/65]
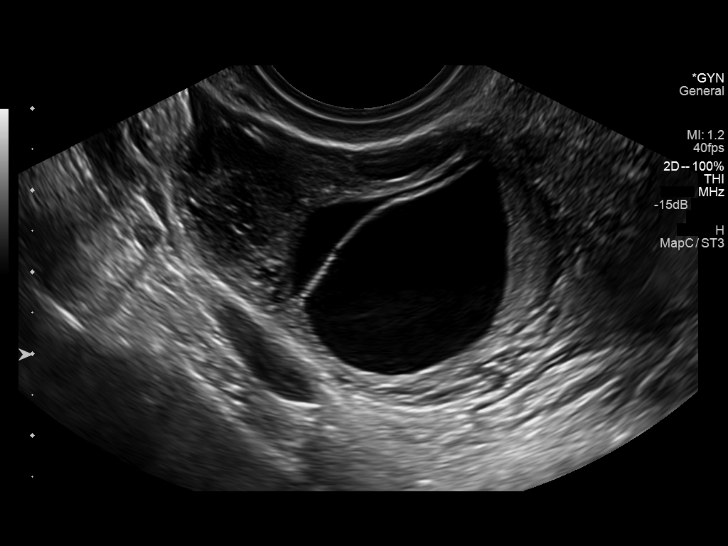
[im 65/65]
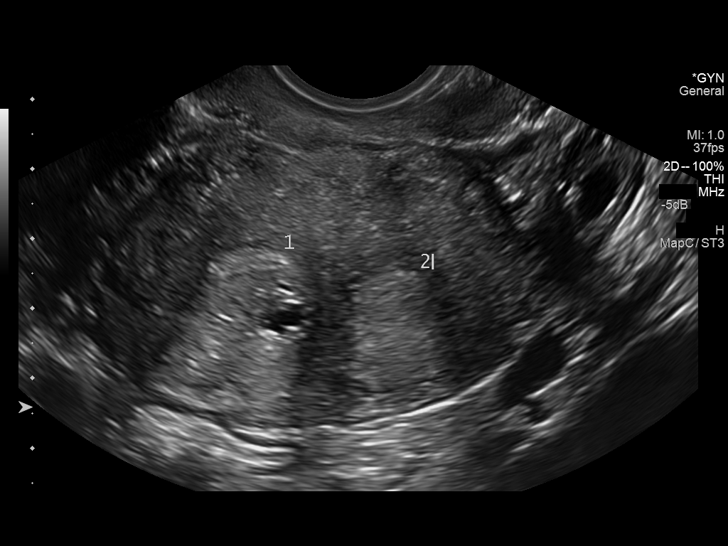

[13 of 25 positions shown; findings below may reference images not displayed]

FINDINGS: Uterus

Measurements: 9.1 x 4.6 x 6.2 cm. No fibroids or other mass
visualized. Bicornuate versus septate uterus noted. Multiple
nabothian cysts noted.

Endometrium

Thickness: 5.1 mm.  No focal abnormality visualized.

Right ovary

Measurements: 6.2 x 3.7 x 4.9 cm. Normal color Doppler arterial and
venous flow seen to the right ovary without evidence of torsion. A
large septated cyst measuring 3.2 x 2.9 x 3.3 cm present within the
right ovary. No internal vascularity seen within the cysts.
Additional well-circumscribed hyperechoic lesion measuring 2.6 x
x 3.1 cm seen within the right ovary. No internal vascularity seen
within this lesion as well. There is increased through transmission.
This may represent a hemorrhagic cyst.

Left ovary

Measurements: 4.2 x 1.7 x 1.4 cm. Normal appearance/no adnexal mass.
Normal color Doppler arterial and venous flow seen to the left ovary
without evidence of torsion.

Other findings:  Moderate free fluid within the pelvis.
IMPRESSION: 1. No sonographic evidence of ovarian torsion.
2. 3.2 x 2.9 x 3.3 cm septated right ovarian cyst, indeterminate. A
follow-up ultrasound in 6 weeks to ensure resolution is recommended.
3. Additional well-circumscribed 2.6 x 2.2 x 3.1 cm hyperechoic
lesion within the right ovary. This may represent a hemorrhagic
cyst. This could also be further assessed at follow-up ultrasound.
4. Normal left ovary.
5. Septate versus bicornuate uterus.
6. Moderate free fluid within the pelvis, likely physiologic.

## 2014-11-17 IMAGING — US US TRANSVAGINAL NON-OB
1 series · 13 of 25 positions shown · non-contrast
Comparison: Pelvic ultrasound 11/15/2013

CLINICAL DATA: Right lower quadrant pain, nausea and vomiting.
History of right ovarian cyst. Evaluate for torsion.

EXAM:
TRANSVAGINAL ULTRASOUND OF PELVIS
DOPPLER ULTRASOUND OF OVARIES
TECHNIQUE: Transvaginal ultrasound examination of the pelvis was performed
including evaluation of the uterus, ovaries, adnexal regions, and
pelvic cul-de-sac.
Color and duplex Doppler ultrasound was utilized to evaluate blood
flow to the ovaries.
It was necessary to proceed with endovaginal exam following the
transabdominal exam to visualize the adnexal structures. Color and
duplex Doppler ultrasound was utilized to evaluate blood flow to the
ovaries.

[Series 1: us transvaginal non-ob · 13 of 53 slices shown]
[im 1/53]
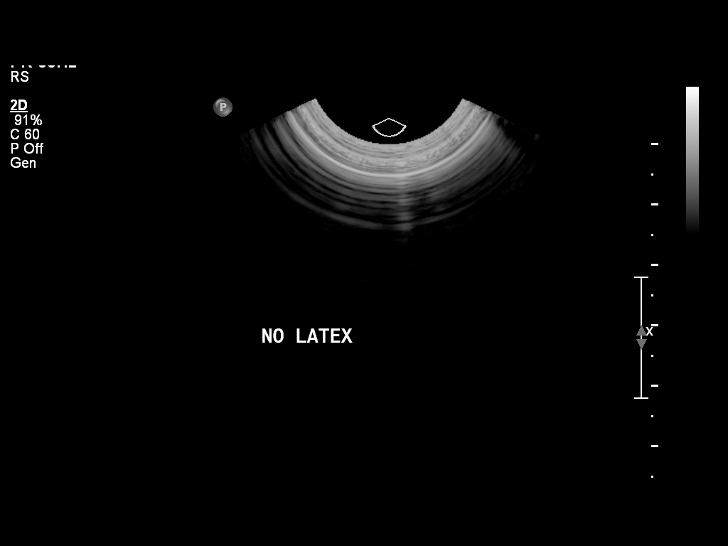
[im 5/53]
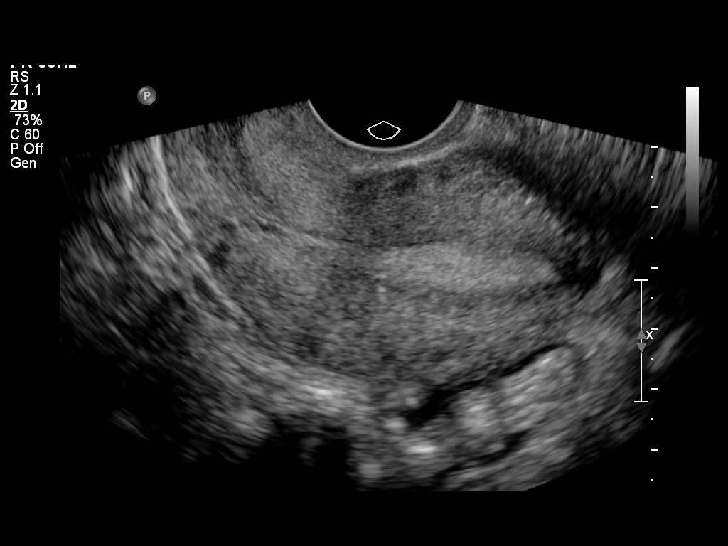
[im 9/53]
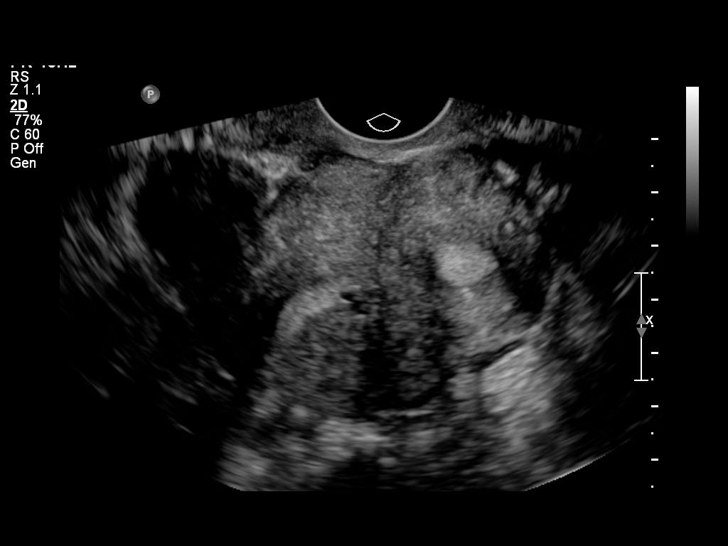
[im 14/53]
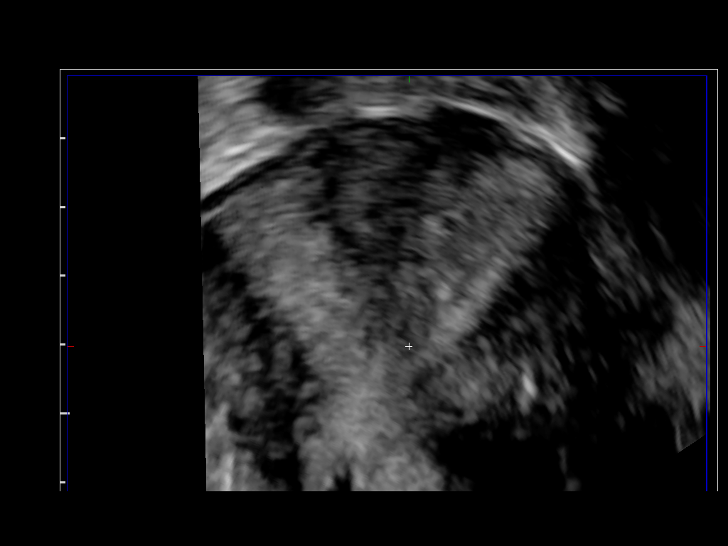
[im 18/53]
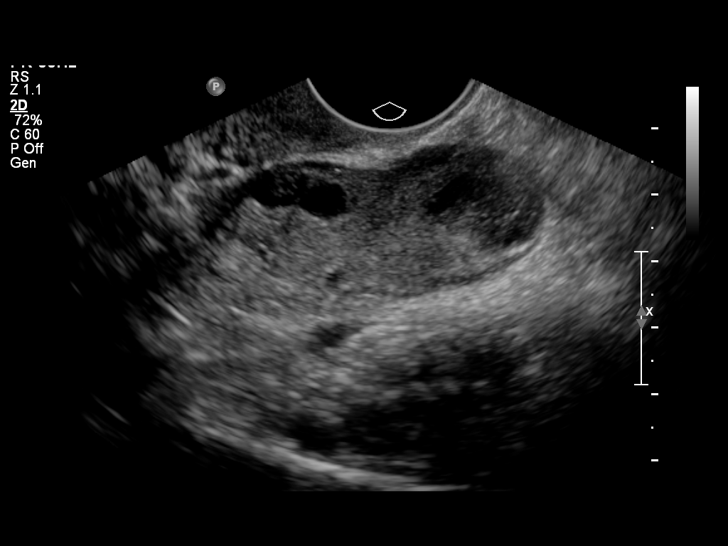
[im 22/53]
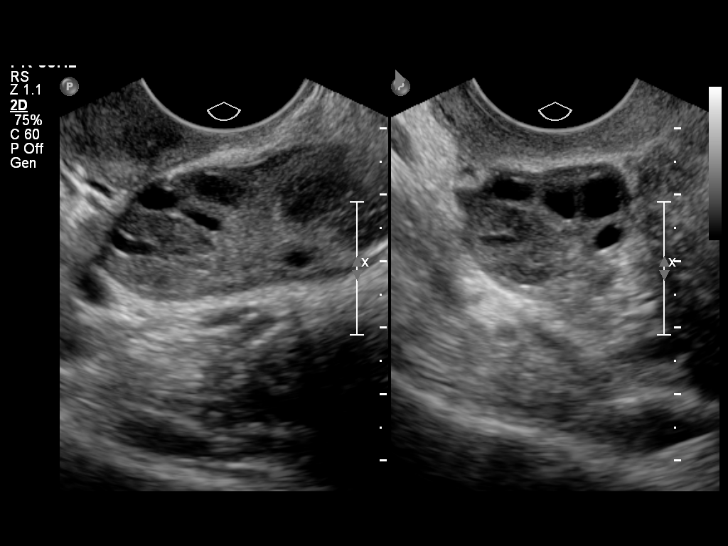
[im 27/53]
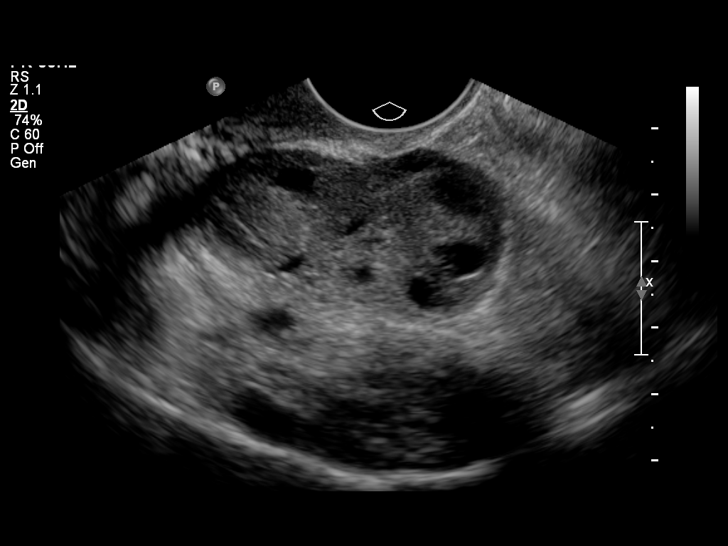
[im 31/53]
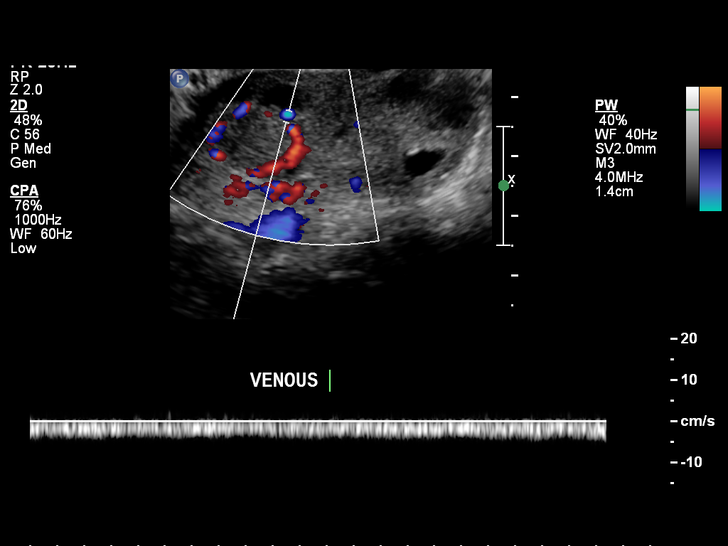
[im 35/53]
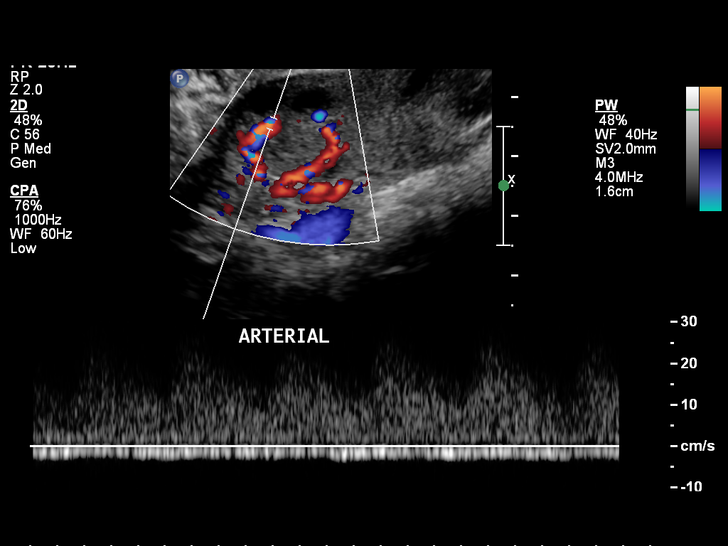
[im 40/53]
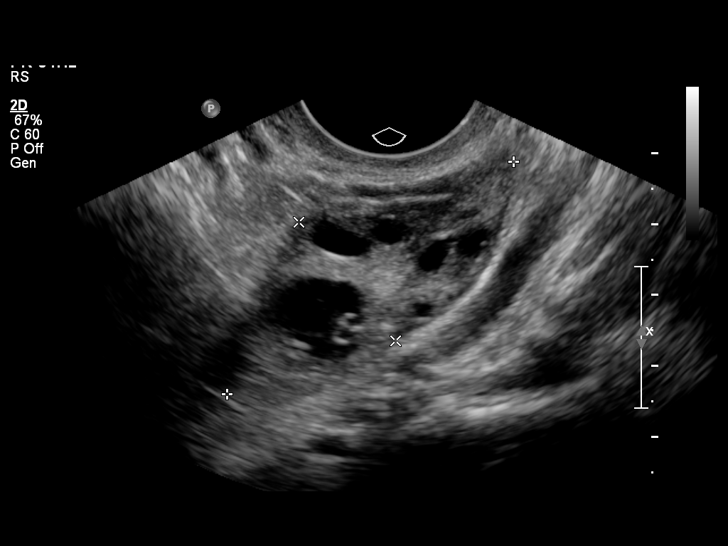
[im 44/53]
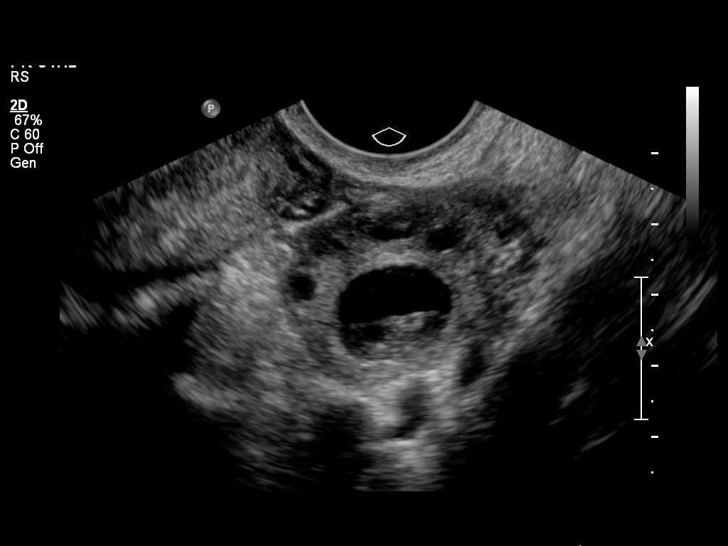
[im 48/53]
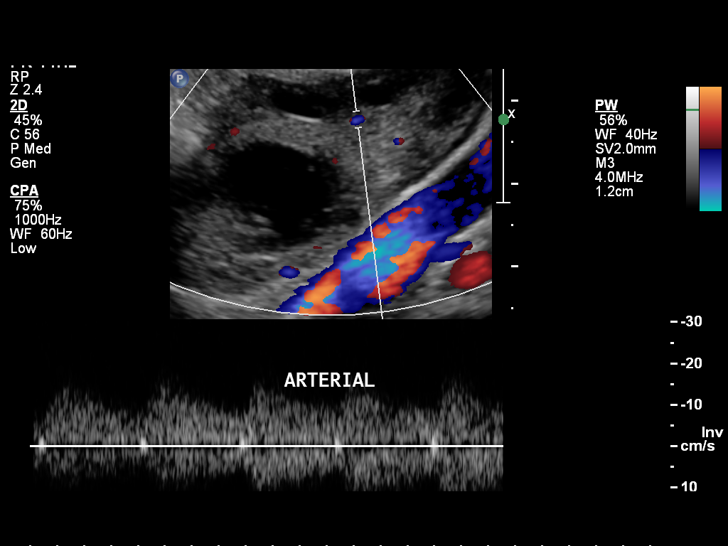
[im 53/53]
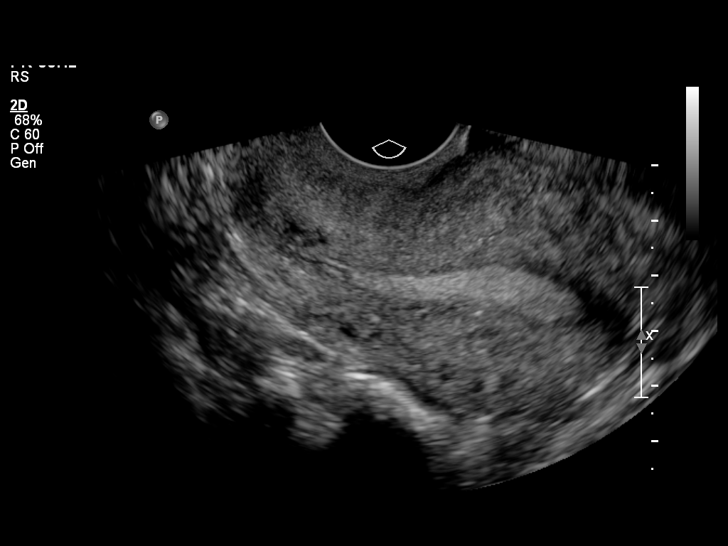

[13 of 25 positions shown; findings below may reference images not displayed]

FINDINGS: Uterus

Measurements: 7.7 x 4.4 x 6.8 cm. No fibroids or other mass
visualized. Retroverted. Bicornuate versus septate uterus noted.

Endometrium

Thickness: 8 mm.  No focal abnormality visualized.

Right ovary

Measurements: 4.6 x 2.5 x 2.6 cm. Within the right ovary there is a
2.0 x 1.6 x 1.7 cm probable hemorrhagic cyst. Additional right
ovarian corpus luteum. Previously described septated right ovarian
cyst is no longer visualized.

Left ovary

Measurements: 5.2 x 2.2 x 2.9 cm. Within the left ovary there is a
1.5 x 1.5 x 1.8 cm probable hemorrhagic cyst.

Pulsed Doppler evaluation demonstrates normal low-resistance
arterial and venous waveforms in both ovaries.
IMPRESSION: Arterial and venous waveforms demonstrated within the bilateral
ovaries. No sonographic evidence to suggest acute ovarian torsion.

Probable hemorrhagic cysts within the bilateral ovaries.
Short-interval follow up ultrasound in 6-12 weeks is recommended,
preferably during the week following the patient's normal menses.

Previously described septated cyst within the right ovary has
resolved.

## 2020-07-05 ENCOUNTER — Emergency Department (HOSPITAL_COMMUNITY)
Admission: EM | Admit: 2020-07-05 | Discharge: 2020-07-05 | Disposition: A | Discharge status: Home / Self Care | Payer: BC Managed Care – PPO | Attending: Emergency Medicine | Admitting: Emergency Medicine

## 2020-07-05 ENCOUNTER — Other Ambulatory Visit: Payer: Self-pay

## 2020-07-05 DIAGNOSIS — R222 Localized swelling, mass and lump, trunk: Secondary | ICD-10-CM | POA: Diagnosis present

## 2020-07-05 DIAGNOSIS — L03311 Cellulitis of abdominal wall: Secondary | ICD-10-CM | POA: Insufficient documentation

## 2020-07-05 DIAGNOSIS — D72829 Elevated white blood cell count, unspecified: Secondary | ICD-10-CM | POA: Insufficient documentation

## 2020-07-05 DIAGNOSIS — Z86018 Personal history of other benign neoplasm: Secondary | ICD-10-CM | POA: Insufficient documentation

## 2020-07-05 DIAGNOSIS — Z23 Encounter for immunization: Secondary | ICD-10-CM | POA: Insufficient documentation

## 2020-07-05 DIAGNOSIS — F17210 Nicotine dependence, cigarettes, uncomplicated: Secondary | ICD-10-CM | POA: Insufficient documentation

## 2020-07-05 DIAGNOSIS — E871 Hypo-osmolality and hyponatremia: Secondary | ICD-10-CM | POA: Insufficient documentation

## 2020-07-05 LAB — CBC WITH DIFFERENTIAL/PLATELET
Abs Immature Granulocytes: 0.12 10*3/uL — ABNORMAL HIGH (ref 0.00–0.07)
Basophils Absolute: 0.1 10*3/uL (ref 0.0–0.1)
Basophils Relative: 1 %
Eosinophils Absolute: 0.2 10*3/uL (ref 0.0–0.5)
Eosinophils Relative: 2 %
HCT: 41.5 % (ref 36.0–46.0)
Hemoglobin: 13.2 g/dL (ref 12.0–15.0)
Immature Granulocytes: 1 %
Lymphocytes Relative: 14 %
Lymphs Abs: 1.8 10*3/uL (ref 0.7–4.0)
MCH: 28 pg (ref 26.0–34.0)
MCHC: 31.8 g/dL (ref 30.0–36.0)
MCV: 88.1 fL (ref 80.0–100.0)
Monocytes Absolute: 0.8 10*3/uL (ref 0.1–1.0)
Monocytes Relative: 6 %
Neutro Abs: 9.9 10*3/uL — ABNORMAL HIGH (ref 1.7–7.7)
Neutrophils Relative %: 76 %
Platelets: 330 10*3/uL (ref 150–400)
RBC: 4.71 MIL/uL (ref 3.87–5.11)
RDW: 12.6 % (ref 11.5–15.5)
WBC: 12.9 10*3/uL — ABNORMAL HIGH (ref 4.0–10.5)
nRBC: 0 % (ref 0.0–0.2)

## 2020-07-05 LAB — BASIC METABOLIC PANEL
Anion gap: 7 (ref 5–15)
BUN: 7 mg/dL (ref 6–20)
CO2: 27 mmol/L (ref 22–32)
Calcium: 8.9 mg/dL (ref 8.9–10.3)
Chloride: 98 mmol/L (ref 98–111)
Creatinine, Ser: 0.71 mg/dL (ref 0.44–1.00)
GFR, Estimated: >60 mL/min (ref >60)
Glucose, Bld: 80 mg/dL (ref 70–99)
Potassium: 3.8 mmol/L (ref 3.5–5.1)
Sodium: 132 mmol/L — ABNORMAL LOW (ref 135–145)

## 2020-07-05 LAB — PREGNANCY, URINE: Preg Test, Ur: NEGATIVE

## 2020-07-05 LAB — CULTURE, BLOOD (ROUTINE X 2)

## 2020-07-05 MED ORDER — CLINDAMYCIN HCL 150 MG PO CAPS: 450.0000 mg | ORAL_CAPSULE | Freq: Three times a day (TID) | ORAL | 0 refills | Status: AC

## 2020-07-05 MED ORDER — TETANUS-DIPHTH-ACELL PERTUSSIS 5-2.5-18.5 LF-MCG/0.5 IM SUSY
0.5000 mL | PREFILLED_SYRINGE | Freq: Once | INTRAMUSCULAR | Status: AC
Start: 2020-07-05 — End: 2020-07-05
  Administered 2020-07-05: 0.5 mL via INTRAMUSCULAR
  Filled 2020-07-05: qty 0.5

## 2020-07-05 MED ORDER — CLINDAMYCIN HCL 150 MG PO CAPS: 450.0000 mg | ORAL_CAPSULE | Freq: Three times a day (TID) | ORAL | 0 refills | Status: DC

## 2020-07-05 MED ORDER — CLINDAMYCIN PHOSPHATE 600 MG/50ML IV SOLN
600.0000 mg | Freq: Once | INTRAVENOUS | Status: AC
  Administered 2020-07-05: 600 mg via INTRAVENOUS
  Filled 2020-07-05: qty 50

## 2020-07-05 NOTE — ED Provider Notes (Signed)
[facility] EMERGENCY DEPARTMENT Provider Note   CSN: [id number] Arrival date & time: 07/05/20  1207     History Chief Complaint  Patient presents with  . Abscess    Kristina Haas is a 45 y.o. female.  HPI    patient with significant medical history of depression anxiety presents with chief complaint of abscess on her pannus.  Patient states she noticed this last Saturday, it was a small red bump which has progressed and gotten larger, she saw her NP who started her on Bactrim as well as Augmentin she has been taking this for the last 6 days without relief.  She has been trying to apply warm compresses to the area but has not noticed any sort of drainage or discharge, she denies any recent trauma to the area, has never experienced in the past, she is not immunocompromise, has no history of IV drug use, no history of MRSA infections.  She denies systemic infection fevers or chills, denies  alleviating factors.  She denies headaches, fevers, chills, shortness of breath, chest pain, abdominal pain, nausea, vomiting, diarrhea, urinary symptoms.  Past Medical History:  Diagnosis Date  . Anemia   . Anxiety   . Depression   . Pneumonia   . Sepsis [facility])     Patient Active Problem List   Diagnosis Date Noted  . 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 CESAREAN SECTION    . DILATION AND CURETTAGE OF UTERUS    . LAPAROSCOPIC OOPHERECTOMY Right 01/30/2014   Procedure: LAPAROSCOPIC OOPHERECTOMY;  Surgeon: Florian Buff, MD;  Location: [facility];  Service: Gynecology;  Laterality: Right;  . TONSILLECTOMY       OB History   No obstetric history on file.     Family History  Problem Relation Age of Onset  . Cancer Mother        uterine  . Diabetes Mother   . COPD Mother   . Hyperlipidemia Mother   . Hypertension Father   . Hyperlipidemia Father   . Diabetes Father   . Hypertension Sister   . Cancer Maternal Grandmother         uterine and ovarian  . COPD Maternal Grandfather   . Hypertension Paternal Grandmother   . Diabetes Paternal Grandmother   . Hypertension Paternal Grandfather   . Diabetes Paternal Grandfather     Social History   Tobacco Use  . Smoking status: Current Every Day Smoker    Packs/day: 0.50    Years: 15.00    Pack years: 7.50    Types: Cigarettes  . Smokeless tobacco: Never Used  Substance Use Topics  . Alcohol use: Yes    Alcohol/week: 0.0 standard drinks    Comment: socially  . Drug use: No    Home Medications Prior to Admission medications   Medication Sig Start Date End Date Taking? Authorizing Provider  ALPRAZolam Duanne Moron) 1 MG tablet Take 1 mg by mouth at bedtime as needed for anxiety.    [provider]  buPROPion (WELLBUTRIN XL) 300 MG 24 hr tablet Take 300 mg by mouth daily.    [provider]  clindamycin (CLEOCIN) 150 MG capsule Take 3 capsules (450 mg total) by mouth 3 (three) times daily for 7 days. 07/05/20 07/12/20  Marcello Fennel, PA-C  diphenhydrAMINE (BENADRYL) 25 MG tablet Take 25 mg by mouth at bedtime as needed for allergies or sleep.    [provider]  HYDROcodone-acetaminophen (NORCO/VICODIN) 5-325 MG per tablet Take 1-2 tablets by mouth every 6 (six) hours as needed. Patient not taking: Reported on 02/06/2014 11/15/13   Ward, Delice Bison, DO  HYDROcodone-acetaminophen (NORCO/VICODIN) 5-325 MG per tablet Take 1 tablet by mouth every 4 (four) hours as needed. Patient not taking: Reported on 02/06/2014 11/15/13   Ward, Delice Bison, DO  ibuprofen (ADVIL,MOTRIN) 800 MG tablet Take 1 tablet (800 mg total) by mouth every 8 (eight) hours as needed for mild pain. 11/15/13   Ward, Delice Bison, DO  ketorolac (TORADOL) 10 MG tablet Take 1 tablet (10 mg total) by mouth every 8 (eight) hours as needed. Patient not taking: Reported on 01/24/2014 12/13/13   Florian Buff, MD  ketorolac (TORADOL) 10 MG tablet Take 1 tablet (10 mg total) by mouth every 8  (eight) hours as needed. Patient not taking: Reported on 01/24/2014 01/24/14   Florian Buff, MD  metroNIDAZOLE (FLAGYL) 500 MG tablet Take 1 tablet (500 mg total) by mouth 2 (two) times daily. Do not drink alcohol with this medication. Patient not taking: Reported on 12/16/2013 11/15/13   Ward, Delice Bison, DO  ondansetron (ZOFRAN ODT) 4 MG disintegrating tablet Take 1 tablet (4 mg total) by mouth every 8 (eight) hours as needed for nausea or vomiting. Patient not taking: Reported on 02/06/2014 11/15/13   Ward, Delice Bison, DO  ondansetron (ZOFRAN) 8 MG tablet Take 1 tablet (8 mg total) by mouth every 8 (eight) hours as needed for nausea. Patient not taking: Reported on 02/06/2014 01/24/14   Florian Buff, MD  oxyCODONE-acetaminophen (PERCOCET/ROXICET) 5-325 MG per tablet Take 1 tablet by mouth every 4 (four) hours as needed for severe pain. Patient not taking: Reported on 02/06/2014 01/24/14   Florian Buff, MD    Allergies    Zofran [ondansetron], Red dye, and Ciprofloxacin hcl  Review of Systems   Review of Systems  Constitutional: Negative for chills and fever.  HENT: Negative for congestion.   Respiratory: Negative for shortness of breath.   Cardiovascular: Negative for chest pain.  Gastrointestinal: Negative for abdominal pain.  Genitourinary: Negative for enuresis.  Musculoskeletal: Negative for back pain.  Skin: Positive for wound. Negative for rash.  Neurological: Negative for dizziness.  Hematological: Does not bruise/bleed easily.    Physical Exam Updated Vital Signs BP (!) 99/55   Pulse 100   Temp 98.2 F (36.8 C)   Resp 19   SpO2 100%   Physical Exam Vitals and nursing note reviewed.  Constitutional:      General: She is not in acute distress.    Appearance: Normal appearance. She is not ill-appearing or diaphoretic.  HENT:     Head: Normocephalic and atraumatic.     Nose: No congestion or rhinorrhea.  Eyes:     Conjunctiva/sclera: Conjunctivae normal.   Pulmonary:     Effort: Pulmonary effort is normal.  Musculoskeletal:     Cervical back: Neck supple.     Right lower leg: No edema.     Left lower leg: No edema.  Skin:    General: Skin is warm and dry.     Comments: Pannus was visualized she has an erythematous macule measuring 4 inches x 4 inches , warm to the touch, there is no active drainage or discharge present, area was indurated but no palpable fluctuation noted, there is no breakage in skin.  Neurological:     Mental Status: She is alert.  Psychiatric:  Mood and Affect: Mood normal.     ED Results / Procedures / Treatments   Labs (all labs ordered are listed, but only abnormal results are displayed) Labs Reviewed  BASIC METABOLIC PANEL - Abnormal; Notable for the following components:      Result Value   Sodium 132 (*)    All other components within normal limits  CBC WITH DIFFERENTIAL/PLATELET - Abnormal; Notable for the following components:   WBC 12.9 (*)    Neutro Abs 9.9 (*)    Abs Immature Granulocytes 0.12 (*)    All other components within normal limits  CULTURE, BLOOD (ROUTINE X 2)  CULTURE, BLOOD (ROUTINE X 2)  PREGNANCY, URINE    EKG None  Radiology No results found.  Procedures Procedures   Medications Ordered in ED Medications  Tdap (BOOSTRIX) injection 0.5 mL (0.5 mLs Intramuscular Given 07/05/20 1352)  clindamycin (CLEOCIN) IVPB 600 mg (0 mg Intravenous Stopped 07/05/20 1537)    ED Course  I have reviewed the triage vital signs and the nursing notes.  Pertinent labs & imaging results that were available during my care of the patient were reviewed by me and considered in my medical decision making (see chart for details).    MDM Rules/Calculators/A&P                         Initial impression-patient presents with abscess of the pannus.  She is alert, does not appear in acute distress, vital signs reassuring.  Will ultrasound area, if area is not amenable for I&D will order lab  work-up and reassess.  Will start patient on IV antibiotics update tetanus shot.  Work-up-CBC shows slight leukocytosis of 12.9, BMP shows hyponatremia of 132, urine pregnancy negative,  Reassessment patient was reassessed, patient is agreeable for discharge at this time.   Rule out-low suspicion for systemic infection as patient is nontoxic-appearing, vital signs reassuring.  Low suspicion for deep tissue infection that is amenable for I&D as there is no fluctuance felt on my exam, ultrasound was negative for fluid collection, there is no large increase in leukocytosis will defer imaging at this time.  Low suspicion for TEN or Katherina Right as there is no systemic rash, no skin sloughing present my exam.  Plan-  1.  Abscess-I suspect patient suffering from cellulitis, will change her antibiotics from Augmentin and Bactrim to clindamycin.  will give her strict return precautions, if symptoms  not improved by 3 days time I want her come back for reevaluation as she may need admission.   Vital signs have remained stable, no indication for hospital admission.  Patient discussed with attending and they agreed with assessment and plan.  Patient given at home care as well strict return precautions.  Patient verbalized that they understood agreed to said plan.   Final Clinical Impression(s) / ED Diagnoses Final diagnoses:  Cellulitis of abdominal wall    Rx / DC Orders ED Discharge Orders         Ordered    clindamycin (CLEOCIN) 150 MG capsule  3 times daily,   Status:  Discontinued        07/05/20 1617    clindamycin (CLEOCIN) 150 MG capsule  3 times daily        07/05/20 1618           Aron Baba 07/05/20 1623    Long, Wonda Olds, MD 07/06/20 [phone]

## 2020-07-05 NOTE — Discharge Instructions (Addendum)
I suspect you have cellulitis of your abdomen, I have started you on antibiotics please take as prescribed, I need you to discontinue taking your Bactrim as well as your Augmentin.  Please continue to place warm compresses on the area, you may take over-the-counter pain medications like ibuprofen and or Tylenol every 6 hours as needed  I want to come back to the emergency department in 3 days time if symptoms do not improve, you have increased redness, increased pain, you develop fevers or chills, as it is possible that your cellulitis needs to be treated with IV antibiotics.

## 2020-07-05 NOTE — ED Triage Notes (Signed)
Pt presents with swollen area to her suprapubic area that started this past week. It started out very small. She has been taking bactrim and augmentin x 6 days. The area has progressively gotten worse. Area is swollen, red, tender to touch.

## 2020-07-08 LAB — CULTURE, BLOOD (ROUTINE X 2)

## 2020-07-10 LAB — CULTURE, BLOOD (ROUTINE X 2)
Culture: NO GROWTH
Culture: NO GROWTH
Special Requests: ADEQUATE
